# Patient Record
Sex: Male | Born: 1935 | Race: White | Hispanic: No | Marital: Married | Smoking: Former smoker
Health system: Southern US, Community
[De-identification: ages and names within clinical notes are randomized; demographics above are authoritative.]

## PROBLEM LIST (undated history)

## (undated) DIAGNOSIS — J449 Chronic obstructive pulmonary disease, unspecified: Secondary | ICD-10-CM

## (undated) DIAGNOSIS — K8020 Calculus of gallbladder without cholecystitis without obstruction: Secondary | ICD-10-CM

## (undated) DIAGNOSIS — E785 Hyperlipidemia, unspecified: Secondary | ICD-10-CM

## (undated) DIAGNOSIS — S4290XA Fracture of unspecified shoulder girdle, part unspecified, initial encounter for closed fracture: Secondary | ICD-10-CM

## (undated) DIAGNOSIS — I10 Essential (primary) hypertension: Secondary | ICD-10-CM

## (undated) DIAGNOSIS — E039 Hypothyroidism, unspecified: Secondary | ICD-10-CM

## (undated) DIAGNOSIS — I471 Supraventricular tachycardia: Secondary | ICD-10-CM

## (undated) DIAGNOSIS — C3490 Malignant neoplasm of unspecified part of unspecified bronchus or lung: Secondary | ICD-10-CM

## (undated) DIAGNOSIS — R918 Other nonspecific abnormal finding of lung field: Principal | ICD-10-CM

## (undated) DIAGNOSIS — E119 Type 2 diabetes mellitus without complications: Secondary | ICD-10-CM

## (undated) DIAGNOSIS — K5730 Diverticulosis of large intestine without perforation or abscess without bleeding: Secondary | ICD-10-CM

## (undated) HISTORY — DX: Supraventricular tachycardia: I47.1

## (undated) HISTORY — DX: Other nonspecific abnormal finding of lung field: R91.8

## (undated) HISTORY — PX: INGUINAL HERNIA REPAIR: SHX194

## (undated) HISTORY — DX: Essential (primary) hypertension: I10

## (undated) HISTORY — DX: Calculus of gallbladder without cholecystitis without obstruction: K80.20

## (undated) HISTORY — DX: Hypothyroidism, unspecified: E03.9

## (undated) HISTORY — DX: Malignant neoplasm of unspecified part of unspecified bronchus or lung: C34.90

## (undated) HISTORY — DX: Hyperlipidemia, unspecified: E78.5

## (undated) HISTORY — PX: CATARACT EXTRACTION: SUR2

## (undated) HISTORY — DX: Chronic obstructive pulmonary disease, unspecified: J44.9

## (undated) HISTORY — DX: Diverticulosis of large intestine without perforation or abscess without bleeding: K57.30

## (undated) HISTORY — DX: Type 2 diabetes mellitus without complications: E11.9

## (undated) HISTORY — DX: Fracture of unspecified shoulder girdle, part unspecified, initial encounter for closed fracture: S42.90XA

---

## 1998-05-06 ENCOUNTER — Ambulatory Visit (HOSPITAL_COMMUNITY): Admission: RE | Admit: 1998-05-06 | Discharge: 1998-05-06 | Payer: Self-pay | Admitting: Family Medicine

## 1998-05-27 ENCOUNTER — Encounter: Payer: Self-pay | Admitting: Internal Medicine

## 1998-05-27 ENCOUNTER — Inpatient Hospital Stay (HOSPITAL_COMMUNITY): Admission: EM | Admit: 1998-05-27 | Discharge: 1998-06-11 | Payer: Self-pay | Admitting: Family Medicine

## 1998-06-03 ENCOUNTER — Encounter: Payer: Self-pay | Admitting: Pulmonary Disease

## 1998-07-02 ENCOUNTER — Encounter: Admission: RE | Admit: 1998-07-02 | Discharge: 1998-09-30 | Payer: Self-pay | Admitting: *Deleted

## 2001-08-28 ENCOUNTER — Encounter: Payer: Self-pay | Admitting: Family Medicine

## 2001-08-28 ENCOUNTER — Encounter: Admission: RE | Admit: 2001-08-28 | Discharge: 2001-08-28 | Payer: Self-pay | Admitting: Family Medicine

## 2002-01-16 ENCOUNTER — Encounter: Payer: Self-pay | Admitting: Surgery

## 2002-01-16 ENCOUNTER — Encounter: Admission: RE | Admit: 2002-01-16 | Discharge: 2002-01-16 | Payer: Self-pay | Admitting: Surgery

## 2002-02-04 ENCOUNTER — Encounter: Payer: Self-pay | Admitting: Surgery

## 2002-02-04 ENCOUNTER — Encounter: Admission: RE | Admit: 2002-02-04 | Discharge: 2002-02-04 | Payer: Self-pay | Admitting: Surgery

## 2002-02-05 ENCOUNTER — Ambulatory Visit (HOSPITAL_BASED_OUTPATIENT_CLINIC_OR_DEPARTMENT_OTHER): Admission: RE | Admit: 2002-02-05 | Discharge: 2002-02-05 | Payer: Self-pay | Admitting: Surgery

## 2003-10-15 ENCOUNTER — Encounter: Admission: RE | Admit: 2003-10-15 | Discharge: 2004-01-13 | Payer: Self-pay | Admitting: Family Medicine

## 2004-10-12 ENCOUNTER — Emergency Department (HOSPITAL_COMMUNITY): Admission: EM | Admit: 2004-10-12 | Discharge: 2004-10-12 | Payer: Self-pay | Admitting: Emergency Medicine

## 2005-01-26 ENCOUNTER — Inpatient Hospital Stay (HOSPITAL_COMMUNITY): Admission: EM | Admit: 2005-01-26 | Discharge: 2005-01-29 | Payer: Self-pay | Admitting: Emergency Medicine

## 2005-01-28 ENCOUNTER — Ambulatory Visit: Payer: Self-pay | Admitting: Cardiovascular Disease

## 2005-01-28 ENCOUNTER — Ambulatory Visit: Payer: Self-pay | Admitting: Pulmonary Disease

## 2005-02-23 ENCOUNTER — Ambulatory Visit: Payer: Self-pay | Admitting: Internal Medicine

## 2005-03-07 ENCOUNTER — Ambulatory Visit: Payer: Self-pay | Admitting: Internal Medicine

## 2005-03-16 ENCOUNTER — Inpatient Hospital Stay (HOSPITAL_COMMUNITY): Admission: EM | Admit: 2005-03-16 | Discharge: 2005-03-18 | Payer: Self-pay | Admitting: *Deleted

## 2005-04-04 ENCOUNTER — Ambulatory Visit: Payer: Self-pay | Admitting: Internal Medicine

## 2005-05-19 ENCOUNTER — Ambulatory Visit: Payer: Self-pay | Admitting: Internal Medicine

## 2005-06-01 ENCOUNTER — Encounter: Admission: RE | Admit: 2005-06-01 | Discharge: 2005-06-01 | Payer: Self-pay | Admitting: Family Medicine

## 2005-06-03 ENCOUNTER — Ambulatory Visit: Payer: Self-pay | Admitting: Internal Medicine

## 2005-06-20 ENCOUNTER — Ambulatory Visit: Payer: Self-pay | Admitting: Pulmonary Disease

## 2005-07-18 ENCOUNTER — Ambulatory Visit: Payer: Self-pay | Admitting: Internal Medicine

## 2005-12-07 ENCOUNTER — Ambulatory Visit: Payer: Self-pay | Admitting: Family Medicine

## 2005-12-08 ENCOUNTER — Ambulatory Visit: Payer: Self-pay | Admitting: Internal Medicine

## 2006-01-11 ENCOUNTER — Ambulatory Visit: Payer: Self-pay | Admitting: Family Medicine

## 2006-03-10 ENCOUNTER — Ambulatory Visit: Payer: Self-pay | Admitting: Family Medicine

## 2006-03-10 LAB — CONVERTED CEMR LAB
Calcium: 9.4 mg/dL (ref 8.4–10.5)
Chloride: 106 meq/L (ref 96–112)
Creatinine,U: 79.3 mg/dL
GFR calc non Af Amer: 88 mL/min
Hgb A1c MFr Bld: 6.3 % — ABNORMAL HIGH (ref 4.6–6.0)
LDL Cholesterol: 83 mg/dL (ref 0–99)
Microalb Creat Ratio: 2.5 mg/g (ref 0.0–30.0)
VLDL: 11 mg/dL (ref 0–40)

## 2006-05-14 DIAGNOSIS — I10 Essential (primary) hypertension: Secondary | ICD-10-CM | POA: Insufficient documentation

## 2006-05-14 DIAGNOSIS — J449 Chronic obstructive pulmonary disease, unspecified: Secondary | ICD-10-CM

## 2006-05-14 DIAGNOSIS — E039 Hypothyroidism, unspecified: Secondary | ICD-10-CM | POA: Insufficient documentation

## 2006-05-14 DIAGNOSIS — E119 Type 2 diabetes mellitus without complications: Secondary | ICD-10-CM

## 2006-05-14 DIAGNOSIS — E785 Hyperlipidemia, unspecified: Secondary | ICD-10-CM

## 2006-06-12 ENCOUNTER — Ambulatory Visit: Payer: Self-pay | Admitting: Family Medicine

## 2006-06-12 LAB — CONVERTED CEMR LAB
Calcium: 9.4 mg/dL (ref 8.4–10.5)
Chloride: 109 meq/L (ref 96–112)
Creatinine,U: 41.2 mg/dL
GFR calc Af Amer: 106 mL/min
GFR calc non Af Amer: 88 mL/min
HDL: 44.8 mg/dL (ref >39.0)
Hgb A1c MFr Bld: 6.6 % — ABNORMAL HIGH (ref 4.6–6.0)
LDL Cholesterol: 81 mg/dL (ref 0–99)
Sodium: 146 meq/L — ABNORMAL HIGH (ref 135–145)
VLDL: 15 mg/dL (ref 0–40)

## 2006-06-28 ENCOUNTER — Ambulatory Visit: Payer: Self-pay | Admitting: Pulmonary Disease

## 2006-06-28 ENCOUNTER — Ambulatory Visit: Payer: Self-pay | Admitting: Gastroenterology

## 2006-07-12 ENCOUNTER — Ambulatory Visit: Payer: Self-pay | Admitting: Gastroenterology

## 2006-07-12 LAB — HM COLONOSCOPY

## 2006-09-26 ENCOUNTER — Ambulatory Visit: Payer: Self-pay | Admitting: Family Medicine

## 2006-09-27 LAB — CONVERTED CEMR LAB
AST: 21 units/L (ref 0–37)
Calcium: 9.7 mg/dL (ref 8.4–10.5)
Cholesterol: 156 mg/dL (ref 0–200)
GFR calc Af Amer: 84 mL/min
GFR calc non Af Amer: 70 mL/min
Glucose, Bld: 128 mg/dL — ABNORMAL HIGH (ref 70–99)
LDL Cholesterol: 97 mg/dL (ref 0–99)
Potassium: 4.5 meq/L (ref 3.5–5.1)
Sodium: 144 meq/L (ref 135–145)
Total CHOL/HDL Ratio: 3.5
Triglycerides: 71 mg/dL (ref 0–149)

## 2006-09-28 ENCOUNTER — Telehealth (INDEPENDENT_AMBULATORY_CARE_PROVIDER_SITE_OTHER): Payer: Self-pay | Admitting: *Deleted

## 2006-12-29 ENCOUNTER — Ambulatory Visit: Payer: Self-pay | Admitting: Family Medicine

## 2006-12-31 LAB — CONVERTED CEMR LAB
BUN: 16 mg/dL (ref 6–23)
CO2: 31 meq/L (ref 19–32)
Calcium: 9.3 mg/dL (ref 8.4–10.5)
Chloride: 105 meq/L (ref 96–112)
Creatinine, Ser: 0.9 mg/dL (ref 0.4–1.5)
Hgb A1c MFr Bld: 6.6 % — ABNORMAL HIGH (ref 4.6–6.0)
TSH: 4.62 microintl units/mL (ref 0.35–5.50)

## 2007-01-01 ENCOUNTER — Encounter (INDEPENDENT_AMBULATORY_CARE_PROVIDER_SITE_OTHER): Payer: Self-pay | Admitting: *Deleted

## 2007-01-01 ENCOUNTER — Telehealth (INDEPENDENT_AMBULATORY_CARE_PROVIDER_SITE_OTHER): Payer: Self-pay | Admitting: *Deleted

## 2007-04-18 ENCOUNTER — Ambulatory Visit: Payer: Self-pay | Admitting: Family Medicine

## 2007-04-20 ENCOUNTER — Encounter (INDEPENDENT_AMBULATORY_CARE_PROVIDER_SITE_OTHER): Payer: Self-pay | Admitting: *Deleted

## 2007-04-20 LAB — CONVERTED CEMR LAB
ALT: 11 units/L (ref 0–53)
AST: 17 units/L (ref 0–37)
CO2: 29 meq/L (ref 19–32)
Cholesterol: 141 mg/dL (ref 0–200)
Creatinine, Ser: 1 mg/dL (ref 0.4–1.5)
GFR calc Af Amer: 94 mL/min
Glucose, Bld: 106 mg/dL — ABNORMAL HIGH (ref 70–99)
Microalb Creat Ratio: 5.1 mg/g (ref 0.0–30.0)
Microalb, Ur: 0.2 mg/dL (ref 0.0–1.9)
PSA: 1.31 ng/mL (ref 0.10–4.00)
Potassium: 4.2 meq/L (ref 3.5–5.1)
Total CHOL/HDL Ratio: 3.6
Triglycerides: 79 mg/dL (ref 0–149)

## 2007-07-17 ENCOUNTER — Ambulatory Visit: Payer: Self-pay | Admitting: Internal Medicine

## 2007-07-17 DIAGNOSIS — K5730 Diverticulosis of large intestine without perforation or abscess without bleeding: Secondary | ICD-10-CM | POA: Insufficient documentation

## 2007-07-17 DIAGNOSIS — Z8719 Personal history of other diseases of the digestive system: Secondary | ICD-10-CM

## 2007-10-22 ENCOUNTER — Ambulatory Visit: Payer: Self-pay | Admitting: Internal Medicine

## 2007-10-26 ENCOUNTER — Telehealth (INDEPENDENT_AMBULATORY_CARE_PROVIDER_SITE_OTHER): Payer: Self-pay | Admitting: *Deleted

## 2007-10-26 LAB — CONVERTED CEMR LAB
Basophils Absolute: 0.1 10*3/uL (ref 0.0–0.1)
Basophils Relative: 1.2 % (ref 0.0–3.0)
CO2: 32 meq/L (ref 19–32)
Chloride: 103 meq/L (ref 96–112)
Creatinine, Ser: 1 mg/dL (ref 0.4–1.5)
Eosinophils Absolute: 0.2 10*3/uL (ref 0.0–0.7)
GFR calc non Af Amer: 78 mL/min
Hgb A1c MFr Bld: 7 % — ABNORMAL HIGH (ref 4.6–6.0)
Lymphocytes Relative: 18.3 % (ref 12.0–46.0)
MCHC: 33.7 g/dL (ref 30.0–36.0)
MCV: 100.3 fL — ABNORMAL HIGH (ref 78.0–100.0)
Neutrophils Relative %: 71.2 % (ref 43.0–77.0)
Platelets: 175 10*3/uL (ref 150–400)
Potassium: 4.7 meq/L (ref 3.5–5.1)
RBC: 4.1 M/uL — ABNORMAL LOW (ref 4.22–5.81)
TSH: 2.79 microintl units/mL (ref 0.35–5.50)
WBC: 7 10*3/uL (ref 4.5–10.5)

## 2008-01-21 ENCOUNTER — Telehealth (INDEPENDENT_AMBULATORY_CARE_PROVIDER_SITE_OTHER): Payer: Self-pay | Admitting: *Deleted

## 2008-02-07 ENCOUNTER — Ambulatory Visit: Payer: Self-pay | Admitting: Internal Medicine

## 2008-02-07 DIAGNOSIS — R799 Abnormal finding of blood chemistry, unspecified: Secondary | ICD-10-CM

## 2008-02-12 ENCOUNTER — Telehealth (INDEPENDENT_AMBULATORY_CARE_PROVIDER_SITE_OTHER): Payer: Self-pay | Admitting: *Deleted

## 2008-02-12 LAB — CONVERTED CEMR LAB
ALT: 13 units/L (ref 0–53)
AST: 17 units/L (ref 0–37)
Albumin: 4.4 g/dL (ref 3.5–5.2)
Alkaline Phosphatase: 61 units/L (ref 39–117)
Cholesterol: 140 mg/dL (ref 0–200)
HDL: 43.2 mg/dL (ref >39.0)
Hgb A1c MFr Bld: 6.8 % — ABNORMAL HIGH (ref 4.6–6.0)
Total CHOL/HDL Ratio: 3.2
Total Protein: 7.5 g/dL (ref 6.0–8.3)
Triglycerides: 39 mg/dL (ref 0–149)

## 2008-02-22 ENCOUNTER — Telehealth (INDEPENDENT_AMBULATORY_CARE_PROVIDER_SITE_OTHER): Payer: Self-pay | Admitting: *Deleted

## 2008-04-22 LAB — HM DIABETES EYE EXAM: HM Diabetic Eye Exam: NORMAL

## 2008-05-12 ENCOUNTER — Encounter: Payer: Self-pay | Admitting: Internal Medicine

## 2008-06-05 ENCOUNTER — Ambulatory Visit: Payer: Self-pay | Admitting: Internal Medicine

## 2008-06-10 LAB — CONVERTED CEMR LAB
CO2: 31 meq/L (ref 19–32)
Calcium: 9.3 mg/dL (ref 8.4–10.5)
Glucose, Bld: 113 mg/dL — ABNORMAL HIGH (ref 70–99)
Microalb, Ur: 0.8 mg/dL (ref 0.0–1.9)
PSA: 2.14 ng/mL (ref 0.10–4.00)
Potassium: 4.3 meq/L (ref 3.5–5.1)
Sodium: 142 meq/L (ref 135–145)

## 2008-07-22 ENCOUNTER — Telehealth (INDEPENDENT_AMBULATORY_CARE_PROVIDER_SITE_OTHER): Payer: Self-pay | Admitting: *Deleted

## 2008-07-22 ENCOUNTER — Ambulatory Visit: Payer: Self-pay | Admitting: Pulmonary Disease

## 2008-09-23 ENCOUNTER — Ambulatory Visit: Payer: Self-pay | Admitting: Pulmonary Disease

## 2008-10-15 ENCOUNTER — Encounter (INDEPENDENT_AMBULATORY_CARE_PROVIDER_SITE_OTHER): Payer: Self-pay | Admitting: *Deleted

## 2008-10-17 ENCOUNTER — Ambulatory Visit: Payer: Self-pay | Admitting: Pulmonary Disease

## 2008-11-05 ENCOUNTER — Ambulatory Visit: Payer: Self-pay | Admitting: Internal Medicine

## 2008-11-07 LAB — CONVERTED CEMR LAB
ALT: 10 units/L (ref 0–53)
AST: 17 units/L (ref 0–37)
CO2: 32 meq/L (ref 19–32)
Calcium: 9.5 mg/dL (ref 8.4–10.5)
Creatinine, Ser: 1 mg/dL (ref 0.4–1.5)
GFR calc non Af Amer: 77.29 mL/min (ref >60)
Glucose, Bld: 123 mg/dL — ABNORMAL HIGH (ref 70–99)
Hgb A1c MFr Bld: 6.8 % — ABNORMAL HIGH (ref 4.6–6.5)
PSA: 1.47 ng/mL (ref 0.10–4.00)

## 2009-02-09 ENCOUNTER — Ambulatory Visit: Payer: Self-pay | Admitting: Internal Medicine

## 2009-03-06 ENCOUNTER — Ambulatory Visit: Payer: Self-pay | Admitting: Internal Medicine

## 2009-03-10 ENCOUNTER — Encounter (INDEPENDENT_AMBULATORY_CARE_PROVIDER_SITE_OTHER): Payer: Self-pay | Admitting: *Deleted

## 2009-03-10 LAB — CONVERTED CEMR LAB
Basophils Relative: 0.1 % (ref 0.0–3.0)
CO2: 30 meq/L (ref 19–32)
Chloride: 102 meq/L (ref 96–112)
Eosinophils Absolute: 0.2 10*3/uL (ref 0.0–0.7)
Glucose, Bld: 138 mg/dL — ABNORMAL HIGH (ref 70–99)
Hemoglobin: 14.1 g/dL (ref 13.0–17.0)
Lymphs Abs: 1 10*3/uL (ref 0.7–4.0)
MCHC: 33.4 g/dL (ref 30.0–36.0)
MCV: 100.9 fL — ABNORMAL HIGH (ref 78.0–100.0)
Monocytes Absolute: 0.8 10*3/uL (ref 0.1–1.0)
Neutro Abs: 9.6 10*3/uL — ABNORMAL HIGH (ref 1.4–7.7)
RBC: 4.19 M/uL — ABNORMAL LOW (ref 4.22–5.81)
Sodium: 140 meq/L (ref 135–145)
Total CHOL/HDL Ratio: 3
Triglycerides: 76 mg/dL (ref 0.0–149.0)

## 2009-03-25 ENCOUNTER — Telehealth (INDEPENDENT_AMBULATORY_CARE_PROVIDER_SITE_OTHER): Payer: Self-pay | Admitting: *Deleted

## 2009-04-20 ENCOUNTER — Ambulatory Visit: Payer: Self-pay | Admitting: Internal Medicine

## 2009-07-06 ENCOUNTER — Ambulatory Visit: Payer: Self-pay | Admitting: Internal Medicine

## 2009-07-15 ENCOUNTER — Telehealth (INDEPENDENT_AMBULATORY_CARE_PROVIDER_SITE_OTHER): Payer: Self-pay | Admitting: *Deleted

## 2009-07-15 LAB — CONVERTED CEMR LAB
Basophils Relative: 0.3 % (ref 0.0–3.0)
Eosinophils Relative: 3 % (ref 0.0–5.0)
HCT: 40.2 % (ref 39.0–52.0)
Hemoglobin: 13.7 g/dL (ref 13.0–17.0)
MCV: 96.8 fL (ref 78.0–100.0)
Monocytes Absolute: 0.5 10*3/uL (ref 0.1–1.0)
Neutrophils Relative %: 76.2 % (ref 43.0–77.0)
RBC: 4.15 M/uL — ABNORMAL LOW (ref 4.22–5.81)
WBC: 7.3 10*3/uL (ref 4.5–10.5)

## 2009-10-19 ENCOUNTER — Ambulatory Visit: Payer: Self-pay | Admitting: Internal Medicine

## 2009-10-19 LAB — CONVERTED CEMR LAB
ALT: 9 units/L (ref 0–53)
AST: 16 units/L (ref 0–37)
CO2: 30 meq/L (ref 19–32)
Calcium: 9.9 mg/dL (ref 8.4–10.5)
Glucose, Bld: 114 mg/dL — ABNORMAL HIGH (ref 70–99)
HDL: 48.9 mg/dL (ref >39.00)
Microalb, Ur: 2.2 mg/dL — ABNORMAL HIGH (ref 0.0–1.9)
Potassium: 5.8 meq/L — ABNORMAL HIGH (ref 3.5–5.1)
Sodium: 141 meq/L (ref 135–145)

## 2009-10-23 ENCOUNTER — Telehealth: Payer: Self-pay | Admitting: Internal Medicine

## 2009-11-03 ENCOUNTER — Ambulatory Visit: Payer: Self-pay | Admitting: Internal Medicine

## 2009-11-06 LAB — CONVERTED CEMR LAB
BUN: 25 mg/dL — ABNORMAL HIGH (ref 6–23)
CO2: 27 meq/L (ref 19–32)
Chloride: 103 meq/L (ref 96–112)
Glucose, Bld: 91 mg/dL (ref 70–99)
Potassium: 5 meq/L (ref 3.5–5.1)

## 2010-02-19 ENCOUNTER — Ambulatory Visit: Payer: Self-pay | Admitting: Internal Medicine

## 2010-02-24 LAB — CONVERTED CEMR LAB
AST: 16 units/L (ref 0–37)
TSH: 1.39 microintl units/mL (ref 0.35–5.50)

## 2010-04-22 NOTE — Assessment & Plan Note (Signed)
Summary: 3 month roa//lch/rsh from bmp//lch   Vital Signs:  Patient profile:   77 year old male Weight:      182.50 pounds Pulse rate:   76 / minute Pulse rhythm:   regular BP sitting:   138 / 78  (left arm) Cuff size:   regular  Vitals Entered By: Army Fossa CMA (October 19, 2009 8:53 AM) CC: Pt here for f/u on DM: Fasting.   History of Present Illness: ROV  Diabetes-- base on last A1C , metformin dose increased , ambulatory CBGs in the low 100s never more than 130 Hyperlipidemia-- good medication compliance  hypothyroidism-- synthroid dose was increased, reports good medication compliance  Hypertension-- ambulatory BPs  varies but usually around 120/70  Current Medications (verified): 1)  Metformin Hcl 500 Mg  Tabs (Metformin Hcl) .Marland Kitchen.. 1 1/2 By Mouth Two Times A Day 2)  Niacin Cr 1000 Mg  Tbcr (Niacin) .... 2 By Mouth Qd 3)  Gemfibrozil 600 Mg  Tabs (Gemfibrozil) .... Take One Tablet Twice Daily 4)  Synthroid 200 Mcg Tabs (Levothyroxine Sodium) .Marland Kitchen.. 1 Daily - Due Office Visit July 2011 5)  Centrum   Tabs (Multiple Vitamins-Minerals) .... Take 1 Tablet By Mouth Once A Day 6)  Aspirin 81 Mg  Tbec (Aspirin) .... Take 1 Tablet By Mouth Once A Day 7)  Onetouch Ultra Test  Strp (Glucose Blood) .... Check Blood Sugar As Directed. 8)  Diovan Hct 320-25 Mg  Tabs (Valsartan-Hydrochlorothiazide) .... One By Mouth Daily 9)  Symbicort 160-4.5 Mcg/act  Aero (Budesonide-Formoterol Fumarate) .... 2 Puffs First Thing  in Am and 2 Puffs Again in Pm About 12 Hours Later  Allergies (verified): No Known Drug Allergies  Past History:  Past Medical History: COPD--on night O2    - PFT's 7/ 30/2010  FEV1 1.31.( 62%)  ratio 36    - HFA 50% April 20, 2009  Diabetes mellitus, type II Hyperlipidemia hypothyroidism Hypertension     - DC ACE February 09, 2009 (cough) > resolved Diverticulosis, colon gallstones DIVERTICULOSIS, COLON    Past Surgical History: Reviewed history from  07/17/2007 and no changes required. Inguinal herniorrhaphy Cataract extraction  Social History: Reviewed history from 03/06/2009 and no changes required. Married children x 4 (3 girls ) retired Production designer, theatre/television/film for [employer] Former Smoker Alcohol use-no exercise-- active,walks weather permit   Review of Systems CV:  Denies chest pain or discomfort and swelling of feet. Resp:  Denies coughing up blood; no cough -wheezing lately  still usung inhalers as needed . GI:  Denies bloody stools, diarrhea, nausea, and vomiting. Endo:  no low sugar symptoms  no LE paresthesias .  Physical Exam  General:  alert, well-developed, and well-nourished.   Lungs:  normal respiratory effort, no intercostal retractions, no accessory muscle use, and slightly decreased breath sounds.   Heart:  normal rate, regular rhythm, and no murmur.   Pulses:   normal pedal pulses bilaterally Extremities:  no  lower extremity edema Psych:  Oriented X3, not anxious appearing, and not depressed appearing.    Diabetes Management Exam:    Foot Exam (with socks and/or shoes not present):       Sensory-Pinprick/Light touch:          Left medial foot (L-4): normal          Left dorsal foot (L-5): normal          Left lateral foot (S-1): normal          Right  medial foot (L-4): normal          Right dorsal foot (L-5): normal          Right lateral foot (S-1): normal       Sensory-Monofilament:          Left foot: normal          Right foot: normal       Inspection:          Left foot: normal          Right foot: normal       Nails:          Left foot: normal          Right foot: normal   Impression & Recommendations:  Problem # 1:  HYPERTENSION (ICD-401.9) at goal, labs His updated medication list for this problem includes:    Diovan Hct 320-25 Mg Tabs (Valsartan-hydrochlorothiazide) ..... One by mouth daily  Orders: TLB-BMP (Basic Metabolic Panel-BMET) (80048-METABOL)  BP today: 138/78 Prior BP:  130/82 (07/06/2009)  Labs Reviewed: K+: 4.4 (03/06/2009) Creat: : 1.0 (03/06/2009)   Chol: 138 (03/06/2009)   HDL: 42.40 (03/06/2009)   LDL: 80 (03/06/2009)   TG: 76.0 (03/06/2009)  Problem # 2:  HYPERLIPIDEMIA (ICD-272.4) due for labs, good compliance His updated medication list for this problem includes:    Niacin Cr 1000 Mg Tbcr (Niacin) .Marland Kitchen... 2 by mouth once daily    Gemfibrozil 600 Mg Tabs (Gemfibrozil) .Marland Kitchen... Take one tablet twice daily  Orders: TLB-ALT (SGPT) (84460-ALT) TLB-AST (SGOT) (84450-SGOT) TLB-Lipid Panel (80061-LIPID) Venipuncture (56213) Specimen Handling (08657)  Labs Reviewed: SGOT: 20 (07/06/2009)   SGPT: 10 (07/06/2009)   HDL:42.40 (03/06/2009), 43.2 (02/07/2008)  LDL:80 (03/06/2009), 89 (02/07/2008)  Chol:138 (03/06/2009), 140 (02/07/2008)  Trig:76.0 (03/06/2009), 39 (02/07/2008)  Problem # 3:  DIABETES MELLITUS, TYPE II (ICD-250.00) based on the last hemoglobin A1c, we increased the metformin dose. labs  His updated medication list for this problem includes:    Metformin Hcl 500 Mg Tabs (Metformin hcl) .Marland Kitchen... 1 1/2 by mouth two times a day    Aspirin 81 Mg Tbec (Aspirin) .Marland Kitchen... Take 1 tablet by mouth once a day    Diovan Hct 320-25 Mg Tabs (Valsartan-hydrochlorothiazide) ..... One by mouth daily  Orders: TLB-A1C / Hgb A1C (Glycohemoglobin) (83036-A1C) TLB-Microalbumin/Creat Ratio, Urine (82043-MALB)  Labs Reviewed: Creat: 1.0 (03/06/2009)     Last Eye Exam: normal (04/22/2008) Reviewed HgBA1c results: 7.4 (07/06/2009)  6.8 (03/06/2009)  Problem # 4:  HYPOTHYROIDISM NOS (ICD-244.9) Assessment: Unchanged based on his last TSH, we increase the Synthroid dose from 175 to 200 micrograms   His updated medication list for this problem includes:    Synthroid 200 Mcg Tabs (Levothyroxine sodium) .Marland Kitchen... 1 daily  Orders: TLB-TSH (Thyroid Stimulating Hormone) (84443-TSH) Specimen Handling (84696)  Complete Medication List: 1)  Metformin Hcl 500 Mg Tabs  (Metformin hcl) .Marland Kitchen.. 1 1/2 by mouth two times a day 2)  Niacin Cr 1000 Mg Tbcr (Niacin) .... 2 by mouth once daily 3)  Gemfibrozil 600 Mg Tabs (Gemfibrozil) .... Take one tablet twice daily 4)  Synthroid 200 Mcg Tabs (Levothyroxine sodium) .Marland Kitchen.. 1 daily 5)  Centrum Tabs (Multiple vitamins-minerals) .... Take 1 tablet by mouth once a day 6)  Aspirin 81 Mg Tbec (Aspirin) .... Take 1 tablet by mouth once a day 7)  Onetouch Ultra Test Strp (Glucose blood) .... Check blood sugar as directed. 8)  Diovan Hct 320-25 Mg Tabs (Valsartan-hydrochlorothiazide) .... One by mouth daily 9)  Symbicort  160-4.5 Mcg/act Aero (Budesonide-formoterol fumarate) .... 2 puffs first thing  in am and 2 puffs again in pm about 12 hours later  Patient Instructions: 1)  Please schedule a follow-up appointment in 4 months , complete physical exam

## 2010-04-22 NOTE — Progress Notes (Signed)
Summary: refill  Phone Note Refill Request Message from:  Fax from Pharmacy on [address] [address] fax [phone]  Refills Requested: Medication #1:  SYNTHROID 175 MCG  TABS TAKE ONE TABET DAILY Patient says this was already called in. We did not get it! Please resend.  Initial call taken by: Barb Merino,  March 25, 2009 3:05 PM    Prescriptions: SYNTHROID 175 MCG  TABS (LEVOTHYROXINE SODIUM) TAKE ONE TABET DAILY  #30 Tablet x 6   Entered by:   Shary Decamp   Authorized by:   Nolon Rod. Paz MD   Signed by:   Shary Decamp on 03/25/2009   Method used:   Electronically to        [facility]. # [id number]* (retail)       [address]       [place], [place]  [address]       Ph: [phone] or [phone]       Fax: [phone]   RxID:   [id number]

## 2010-04-22 NOTE — Assessment & Plan Note (Signed)
Summary: bp check and bmp//kn      Allergies Added: NKDA Nurse Visit   Vital Signs:  Patient profile:   77 year old male Weight:      179.50 pounds Pulse rate:   92 / minute Pulse rhythm:   regular BP sitting:   118 / 78  (left arm) Cuff size:   regular  Vitals Entered By: Army Fossa CMA (November 03, 2009 10:30 AM)  Impression & Recommendations:  Problem # 1:  HYPERTENSION (ICD-401.9) due to hyperkalemia, BP medication was decreased to half BP still at goal His updated medication list for this problem includes:    Diovan Hct 320-25 Mg Tabs (Valsartan-hydrochlorothiazide) .Marland Kitchen... 1/2 tab  by mouth daily  Orders: TLB-BMP (Basic Metabolic Panel-BMET) (80048-METABOL) Specimen Handling (16109)  BP today: 118/78 Prior BP: 138/78 (10/19/2009)  Labs Reviewed: K+: 5.8 (10/19/2009) Creat: : 1.0 (10/19/2009)   Chol: 139 (10/19/2009)   HDL: 48.90 (10/19/2009)   LDL: 77 (10/19/2009)   TG: 66.0 (10/19/2009)  Complete Medication List: 1)  Metformin Hcl 500 Mg Tabs (Metformin hcl) .Marland Kitchen.. 1 1/2 by mouth two times a day 2)  Niacin Cr 1000 Mg Tbcr (Niacin) .... 2 by mouth once daily 3)  Gemfibrozil 600 Mg Tabs (Gemfibrozil) .... Take one tablet twice daily 4)  Synthroid 200 Mcg Tabs (Levothyroxine sodium) .Marland Kitchen.. 1 daily 5)  Centrum Tabs (Multiple vitamins-minerals) .... Take 1 tablet by mouth once a day 6)  Aspirin 81 Mg Tbec (Aspirin) .... Take 1 tablet by mouth once a day 7)  Onetouch Ultra Test Strp (Glucose blood) .... Check blood sugar as directed. 8)  Diovan Hct 320-25 Mg Tabs (Valsartan-hydrochlorothiazide) .... 1/2 tab  by mouth daily 9)  Symbicort 160-4.5 Mcg/act Aero (Budesonide-formoterol fumarate) .... 2 puffs first thing  in am and 2 puffs again in pm about 12 hours later  CC: BP Check   Allergies (verified): No Known Drug Allergies  Orders Added: 1)  TLB-BMP (Basic Metabolic Panel-BMET) [80048-METABOL] 2)  Specimen Handling [99000] 3)  Est. Patient Level I [60454]

## 2010-04-22 NOTE — Assessment & Plan Note (Signed)
Summary: Pulmonary/ ext f/u ov with hfa teaching   Copy to:  Jose E. Paz MD Primary Provider/Referring Provider:  Nolon Rod. Paz MD  CC:  Pt here for follow up. Pt c/o intermittent productive cough with yellow mucus x 4 days.  History of Present Illness: 44  yowm quit smoking around 2003  with  obesity and COPD on nocturnal O2 FEV-1 of 64% predicted (3/07) with no significant improvement after bronchodilators and persistent hypoxemia on nocturnal study dated June 2007 where he spent 37 minutes at a saturation less 88%, and, therefore, it was recommended he stay on oxygen.  CXR 3/07 chronic changes, scarring Quit smoking 2003  February 09, 2009 ov main c/o persistent dry cough x sev months. no limitations in terms of sob, no need for inhalers or nocturnal exac. rec stop captopril and hctz start diovan 320/25 one daily   April 20, 2009 Pt here for follow up. Pt c/o intermittent productive cough with yellow mucus x 4 days esp when lie down.  better with inhaler, he thinks it's proaire.  Pt denies any significant sore throat, dysphagia, itching, sneezing,  nasal congestion or excess secretions,  fever, chills, sweats, unintended wt loss, pleuritic or exertional cp, hempoptysis, change in activity tolerance  orthopnea pnd or leg swelling Pt also denies any obvious fluctuation in symptoms with weather or environmental change or other alleviating or aggravating factors.       Current Medications (verified): 1)  Metformin Hcl 500 Mg  Tabs (Metformin Hcl) .... Take One Tablet By Mouth Twice Daily 2)  Niacin Cr 1000 Mg  Tbcr (Niacin) .... 2 By Mouth Qd 3)  Gemfibrozil 600 Mg  Tabs (Gemfibrozil) .... Take One Tablet Twice Daily 4)  Synthroid 175 Mcg  Tabs (Levothyroxine Sodium) .... Take One Tabet Daily 5)  Centrum   Tabs (Multiple Vitamins-Minerals) .... Take 1 Tablet By Mouth Once A Day 6)  Aspirin 81 Mg  Tbec (Aspirin) .... Take 1 Tablet By Mouth Once A Day 7)  Onetouch Ultra Test  Strp (Glucose  Blood) .... Check Blood Sugar As Directed. 8)  Diovan Hct 320-25 Mg  Tabs (Valsartan-Hydrochlorothiazide) .... One By Mouth Daily  Allergies (verified): No Known Drug Allergies  Past History:  Past Medical History: COPD--on night O2    - PFT's 7/ 30/2010  FEV1 1.31.( 62%)  ratio 36    - HFA 50% April 20, 2009  Diabetes mellitus, type II Hyperlipidemia hypothyroidism Hypertension     - DC ACE February 09, 2009 (cough) > resolved Diverticulosis, colon gallstones DIVERTICULOSIS, COLON    Vital Signs:  Patient profile:   77 year old male Height:      69 inches Weight:      191.25 pounds O2 Sat:      94 % on Room air Temp:     98.0 degrees F oral Pulse rate:   85 / minute BP sitting:   134 / 62  (left arm) Cuff size:   regular  Vitals Entered By: Zackery Barefoot CMA (April 20, 2009 2:19 PM)  O2 Flow:  Room air CC: Pt here for follow up. Pt c/o intermittent productive cough with yellow mucus x 4 days Comments Medications reviewed with patient Verified pt's contact number Zackery Barefoot CMA  April 20, 2009 2:21 PM    Physical Exam  Additional Exam:  wt 185 > 187 February 09, 2009 > 191 April 20, 2009  Gen. Pleasant, well-nourished, in no distress ENT - no lesions, no post nasal drip,  edentulous Neck: No JVD, no thyromegaly, no carotid bruits Lungs: no use of accessory muscles, no dullness to percussion,  mid exp wheeze, better with purse lips Cardiovascular: Rhythm regular, heart sounds  normal, no murmurs or gallops, no peripheral edema Musculoskeletal: No deformities, no cyanosis or clubbing      Impression & Recommendations:  Problem # 1:  COPD (ICD-496) I spent extra time with the patient today explaining optimal mdi  technique.  This improved from  25-50% effective.  Since responds well to what he thinks is proaire should be a good candidate for trial of symbicort for a significant AB component - though one could argue that might be over treatment  for his chronic rx.    Each maintenance medication was reviewed in detail including most importantly the difference between maintenance and as needed and under what circumstances the prns are to be used. See instructions for specific recommendations   Medications Added to Medication List This Visit: 1)  Centrum Tabs (Multiple vitamins-minerals) .... Take 1 tablet by mouth once a day 2)  Aspirin 81 Mg Tbec (Aspirin) .... Take 1 tablet by mouth once a day 3)  Symbicort 160-4.5 Mcg/act Aero (Budesonide-formoterol fumarate) .... 2 puffs first thing  in am and 2 puffs again in pm about 12 hours later  Other Orders: HFA Instruction [phone]) Est. Patient Level IV (60454)  Patient Instructions: 1)  Work on inhaler technique:  relax and blow all the way out then take a nice smooth deep breath back in, triggering the inhaler at same time you start breathing in 2)  Symbicort 2 puffs first thing  in am and 2 puffs again in pm about 12 hours later and if you like it fill the prescription and return as needed Prescriptions: SYMBICORT 160-4.5 MCG/ACT  AERO (BUDESONIDE-FORMOTEROL FUMARATE) 2 puffs first thing  in am and 2 puffs again in pm about 12 hours later  #1 x 11   Entered and Authorized by:   Nyoka Cowden MD   Signed by:   Nyoka Cowden MD on 04/20/2009   Method used:   Print then Mail to Patient   RxID:   [id number]    Immunization History:  Influenza Immunization History:    Influenza:  historical (01/12/2009)

## 2010-04-22 NOTE — Progress Notes (Signed)
Summary: lab results  Phone Note Outgoing Call   Summary of Call: advise patient -diabetes is better continue present care -potassium is elevated, advised to take only half of the  Diovan HCT  instead of one tablet  -nurse visit fora BMP and BP check in 10 days Lyric Rossano E. Braelyn Bordonaro MD  October 23, 2009 3:25 PM   Follow-up for Phone Call        spoke with pt he is aware of lab results and med changes. Will call to make appt in 10 days. Army Fossa CMA  October 23, 2009 3:31 PM     New/Updated Medications: DIOVAN HCT 320-25 MG  TABS (VALSARTAN-HYDROCHLOROTHIAZIDE) 1/2 tab  by mouth daily

## 2010-04-22 NOTE — Assessment & Plan Note (Signed)
Summary: cpx/fasting//kn   Vital Signs:  Patient profile:   77 year old male Height:      69 inches Weight:      179.25 pounds Pulse rate:   87 / minute Pulse rhythm:   regular BP sitting:   138 / 82  (left arm) Cuff size:   regular  Vitals Entered By: Army Fossa CMA (February 19, 2010 8:41 AM) CC: CPX, fasting  [facility]    History of Present Illness: Here for Medicare AWV: 1.   Risk factors based on Past M, S, F history: reviewed  2.   Physical Activities: walks 2 miles 3/week  3.   Depression/mood: denies, no problems noted  4.   Hearing: denies problems , no problems noted  5.   ADL's: totally independent, still drives  6.   Fall Risk: no recent falls, moderate risk d/t vision.counseled (lights at night, uncluttered house,                   etc) 7.   Home Safety: does feel safe at home  8.   Height, weight, &visual acuity: see VS, not as good as before, to see eye doctor in March                         2012, I  rec to see him sooner since his vision needs improvment   9.   Counseling:  yes  10.   Labs ordered based on risk factors: yes  11.           Referral Coordination, if needed  12.           Care Plan, see a/p  13.            Cognitive Assessment : cognition, motor skills and memory seems appropriate  in addition, we discussed the following issues  COPD-- on symbicort only, uses night O2  Diabetes -- ambulatory CBGs 90s to 115s on average  Hyperlipidemia-- good medication compliance  hypothyroidism-- good medication compliance  Hypertension-- ambulatory BPs  104-120/ 60-70      Current Medications (verified): 1)  Metformin Hcl 500 Mg  Tabs (Metformin Hcl) .Marland Kitchen.. 1 1/2 By Mouth Two Times A Day 2)  Niacin Cr 1000 Mg  Tbcr (Niacin) .... 2 By Mouth Once Daily 3)  Gemfibrozil 600 Mg  Tabs (Gemfibrozil) .... Take One Tablet Twice Daily 4)  Synthroid 200 Mcg Tabs (Levothyroxine Sodium) .Marland Kitchen.. 1 Daily 5)  Centrum   Tabs (Multiple  Vitamins-Minerals) .... Take 1 Tablet By Mouth Once A Day 6)  Aspirin 81 Mg  Tbec (Aspirin) .... Take 1 Tablet By Mouth Once A Day 7)  Onetouch Ultra Test  Strp (Glucose Blood) .... Check Blood Sugar As Directed. 8)  Diovan Hct 320-25 Mg  Tabs (Valsartan-Hydrochlorothiazide) .... 1/2 Tab  By Mouth Daily 9)  Symbicort 160-4.5 Mcg/act  Aero (Budesonide-Formoterol Fumarate) .... 2 Puffs First Thing  in Am and 2 Puffs Again in Pm About 12 Hours Later  Allergies (verified): No Known Drug Allergies  Past History:  Past Medical History: COPD--on night O2    - PFT's 7/ 30/2010  FEV1 1.31.( 62%)  ratio 36    - HFA 50% April 20, 2009  Diabetes mellitus, type II Hyperlipidemia hypothyroidism Hypertension     - DC ACE February 09, 2009 (cough) > resolved Diverticulosis, colon gallstones     Past Surgical History: Reviewed history from 07/17/2007 and  no changes required. Inguinal herniorrhaphy Cataract extraction  Family History: Reviewed history from 07/17/2007 and no changes required. HTN - M DM - no CAD - M (MI) Stroke - no colon ca - no prostate ca - no  Social History: Reviewed history from 03/06/2009 and no changes required. Married children x 4 (3 girls ) retired Production designer, theatre/television/film for [employer] Former Smoker Alcohol use-no exercise-- active,walks weather permit   Review of Systems CV:  Denies chest pain or discomfort and swelling of feet. Resp:  Denies cough and coughing up blood; SOB at baseline . GI:  Denies bloody stools, diarrhea, nausea, and vomiting. GU:  Denies dysuria, hematuria, and urinary frequency; rarely has hesitation.  Physical Exam  General:  alert, well-developed, and well-nourished.   Neck:  no masses, no thyromegaly, and normal carotid upstroke.   Lungs:  normal respiratory effort, no intercostal retractions, no accessory muscle use, and   decreased breath sounds.   Heart:  normal rate, regular rhythm, and no murmur.   Abdomen:  soft,  non-tender, no distention, no masses, no guarding, and no rigidity. no bruit   Rectal:  No external abnormalities noted. Normal sphincter tone. No rectal masses or tenderness. Prostate:  Prostate gland firm and smooth, no enlargement, nodularity, tenderness, mass, asymmetry or induration. No nodularity noted today Extremities:  no pretibial edema bilaterally  Psych:  Oriented X3, memory intact for recent and remote, normally interactive, good eye contact, not anxious appearing, and not depressed appearing.     Impression & Recommendations:  Problem # 1:  HEALTH SCREENING (ICD-V70.0)  Td  2009 Pneumonia shot 2006, and today  flu shot discussed  had a  shingles shot  Cscope 06-2006 @ [facility], next Cscope  "as needed"  counseling about diet. Also see HPI Continue exercising as he is doing PSA, see  next  Orders: Medicare -1st Annual Wellness Visit [phone])  Problem # 2:  ? of NODULAR PROSTATE WITHOUT URINARY OBSTRUCTION (ICD-600.10) today  exam showed no nodule  Orders: TLB-PSA (Prostate Specific Antigen) (84153-PSA)  Problem # 3:  HYPERTENSION (ICD-401.9) at goal  His updated medication list for this problem includes:    Diovan Hct 320-25 Mg Tabs (Valsartan-hydrochlorothiazide) .Marland Kitchen... 1/2 tab  by mouth daily  BP today: 138/82 Prior BP: 118/78 (11/03/2009)  Labs Reviewed: K+: 5.0 (11/03/2009) Creat: : 1.0 (11/03/2009)   Chol: 139 (10/19/2009)   HDL: 48.90 (10/19/2009)   LDL: 77 (10/19/2009)   TG: 66.0 (10/19/2009)  Problem # 4:  HYPERLIPIDEMIA (ICD-272.4) labs His updated medication list for this problem includes:    Niacin Cr 1000 Mg Tbcr (Niacin) .Marland Kitchen... 2 by mouth once daily    Gemfibrozil 600 Mg Tabs (Gemfibrozil) .Marland Kitchen... Take one tablet twice daily  Orders: TLB-ALT (SGPT) (84460-ALT) TLB-AST (SGOT) (84450-SGOT)  Labs Reviewed: SGOT: 16 (10/19/2009)   SGPT: 9 (10/19/2009)   HDL:48.90 (10/19/2009), 42.40 (03/06/2009)  LDL:77 (10/19/2009), 80 (03/06/2009)  Chol:139  (10/19/2009), 138 (03/06/2009)  Trig:66.0 (10/19/2009), 76.0 (03/06/2009)  Problem # 5:  DIABETES MELLITUS, TYPE II (ICD-250.00) labs to have an eye exam 05-2010, encouraged to go sooner if problems   His updated medication list for this problem includes:    Metformin Hcl 500 Mg Tabs (Metformin hcl) .Marland Kitchen... 1 1/2 by mouth two times a day    Aspirin 81 Mg Tbec (Aspirin) .Marland Kitchen... Take 1 tablet by mouth once a day    Diovan Hct 320-25 Mg Tabs (Valsartan-hydrochlorothiazide) .Marland Kitchen... 1/2 tab  by mouth daily  Orders: Venipuncture (98119) TLB-A1C /  Hgb A1C (Glycohemoglobin) (83036-A1C) Specimen Handling ([address])  Labs Reviewed: Creat: 1.0 (11/03/2009)     Last Eye Exam: normal (04/22/2008) Reviewed HgBA1c results: 7.0 (10/19/2009)  7.4 (07/06/2009)  Problem # 6:  COPD (ICD-496) stable at present  inmunizations offered, see #1 His updated medication list for this problem includes:    Symbicort 160-4.5 Mcg/act Aero (Budesonide-formoterol fumarate) .Marland Kitchen... 2 puffs first thing  in am and 2 puffs again in pm about 12 hours later  Complete Medication List: 1)  Metformin Hcl 500 Mg Tabs (Metformin hcl) .Marland Kitchen.. 1 1/2 by mouth two times a day 2)  Niacin Cr 1000 Mg Tbcr (Niacin) .... 2 by mouth once daily 3)  Gemfibrozil 600 Mg Tabs (Gemfibrozil) .... Take one tablet twice daily 4)  Synthroid 200 Mcg Tabs (Levothyroxine sodium) .Marland Kitchen.. 1 daily 5)  Centrum Tabs (Multiple vitamins-minerals) .... Take 1 tablet by mouth once a day 6)  Aspirin 81 Mg Tbec (Aspirin) .... Take 1 tablet by mouth once a day 7)  Onetouch Ultra Test Strp (Glucose blood) .... Check blood sugar as directed. 8)  Diovan Hct 320-25 Mg Tabs (Valsartan-hydrochlorothiazide) .... 1/2 tab  by mouth daily 9)  Symbicort 160-4.5 Mcg/act Aero (Budesonide-formoterol fumarate) .... 2 puffs first thing  in am and 2 puffs again in pm about 12 hours later  Other Orders: TLB-TSH (Thyroid Stimulating Hormone) (84443-TSH) Pneumococcal Vaccine (60454) Admin  1st Vaccine ([address])  Patient Instructions: 1)  Please schedule a follow-up appointment in 4 months .  Prescriptions: GEMFIBROZIL 600 MG  TABS (GEMFIBROZIL) Take one tablet twice daily  #60 Tablet x 3   Entered by:   Army Fossa CMA   Authorized by:   Nolon Rod. Wisam Siefring MD   Signed by:   Army Fossa CMA on 02/19/2010   Method used:   Electronically to        [id number]* (retail)       [address]       [place], [place]  [address]       Ph: [phone] or [phone]       Fax: [phone]   RxID:   [id number] SYNTHROID 200 MCG TABS (LEVOTHYROXINE SODIUM) 1 daily  #90 Tablet x 1   Entered by:   Army Fossa CMA   Authorized by:   Nolon Rod. Makeisha Jentsch MD   Signed by:   Army Fossa CMA on 02/19/2010   Method used:   Electronically to        [id number]* (retail)       [address]       [place], [place]  [address]       Ph: [phone] or [phone]       Fax: [phone]   RxID:   [id number] METFORMIN HCL 500 MG  TABS (METFORMIN HCL) 1 1/2 by mouth two times a day  #90 x 6   Entered by:   Army Fossa CMA   Authorized by:   Nolon Rod. Lucion Dilger MD   Signed by:   Army Fossa CMA on 02/19/2010   Method used:   Electronically to        [id number]* (retail)       [address]       [place], [place]  [address]  Ph: [phone] or [phone]       Fax: [phone]   RxID:   [id number]    Orders Added: 1)  Venipuncture [44010] 2)  TLB-A1C / Hgb A1C (Glycohemoglobin) [83036-A1C] 3)  TLB-PSA (Prostate Specific Antigen) [84153-PSA] 4)  TLB-ALT (SGPT) [84460-ALT] 5)  TLB-AST (SGOT) [84450-SGOT] 6)  TLB-TSH (Thyroid Stimulating Hormone) [84443-TSH] 7)  Pneumococcal Vaccine [90732] 8)  Admin 1st Vaccine [90471] 9)  Specimen Handling [99000] 10)  Est. Patient Level III [27253] 11)  Medicare -1st Annual Wellness Visit  [G0438]   Immunizations Administered:  Pneumonia Vaccine:    Vaccine Type: Pneumovax    Site: left deltoid    Mfr: Merck    Dose: 0.5 ml    Route: IM    Given by: Army Fossa CMA    Exp. Date: 07/21/2011    Lot #: [id number]   Immunizations Administered:  Pneumonia Vaccine:    Vaccine Type: Pneumovax    Site: left deltoid    Mfr: Merck    Dose: 0.5 ml    Route: IM    Given by: Army Fossa CMA    Exp. Date: 07/21/2011    Lot #: [id number]

## 2010-04-22 NOTE — Assessment & Plan Note (Signed)
Summary: 4  mth fu/ns/kdc   Vital Signs:  Patient profile:   77 year old male Height:      69 inches Weight:      190.2 pounds BMI:     28.19 Pulse rate:   62 / minute BP sitting:   130 / 82  Vitals Entered By: Shary Decamp (July 06, 2009 8:02 AM) CC: rov, fasting   History of Present Illness: ROV COPD--note from pulmonary reviewed, uses simbycort "as needed only" Diabetes-- ambulatory CBGs 80 to 120, saw the eye doctor 2-11, reportedly had a DM check recent CBC showed elevated WBC: recheck  hypothyroidism-- good medication compliance  Hypertension-- ambulatory BPs "good", no  recent cough      Current Medications (verified): 1)  Metformin Hcl 500 Mg  Tabs (Metformin Hcl) .... Take One Tablet By Mouth Twice Daily 2)  Niacin Cr 1000 Mg  Tbcr (Niacin) .... 2 By Mouth Qd 3)  Gemfibrozil 600 Mg  Tabs (Gemfibrozil) .... Take One Tablet Twice Daily 4)  Synthroid 175 Mcg  Tabs (Levothyroxine Sodium) .... Take One Tabet Daily 5)  Centrum   Tabs (Multiple Vitamins-Minerals) .... Take 1 Tablet By Mouth Once A Day 6)  Aspirin 81 Mg  Tbec (Aspirin) .... Take 1 Tablet By Mouth Once A Day 7)  Onetouch Ultra Test  Strp (Glucose Blood) .... Check Blood Sugar As Directed. 8)  Diovan Hct 320-25 Mg  Tabs (Valsartan-Hydrochlorothiazide) .... One By Mouth Daily 9)  Symbicort 160-4.5 Mcg/act  Aero (Budesonide-Formoterol Fumarate) .... 2 Puffs First Thing  in Am and 2 Puffs Again in Pm About 12 Hours Later  Allergies (verified): No Known Drug Allergies  Past History:  Past Medical History: COPD--on night O2    - PFT's 7/ 30/2010  FEV1 1.31.( 62%)  ratio 36    - HFA 50% April 20, 2009  Diabetes mellitus, type II Hyperlipidemia hypothyroidism Hypertension     - DC ACE February 09, 2009 (cough) > resolved Diverticulosis, colon gallstones DIVERTICULOSIS, COLON    Social History: Reviewed history from 03/06/2009 and no changes required. 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 GI:  Denies bloody stools, diarrhea, nausea, and vomiting. GU:  Denies hematuria, urinary frequency, and urinary hesitancy.  Physical Exam  General:  alert, well-developed, and well-nourished.   Lungs:  normal respiratory effort, no intercostal retractions, and no accessory muscle use.  Decreased BS, few end exp wheezes  Heart:  normal rate, regular rhythm, and no murmur.   Pulses:  normal pedal pulses bilaterally  Extremities:  no pretibial edema bilaterally   Diabetes Management Exam:    Foot Exam (with socks and/or shoes not present):       Sensory-Pinprick/Light touch:          Left medial foot (L-4): normal          Left dorsal foot (L-5): normal          Left lateral foot (S-1): normal          Right medial foot (L-4): normal          Right dorsal foot (L-5): normal          Right lateral foot (S-1): normal       Sensory-Monofilament:          Left foot: normal  Right foot: normal       Inspection:          Left foot: normal          Right foot: normal       Nails:          Left foot: normal          Right foot: normal   Impression & Recommendations:  Problem # 1:  HYPERTENSION (ICD-401.9) at goal  His updated medication list for this problem includes:    Diovan Hct 320-25 Mg Tabs (Valsartan-hydrochlorothiazide) ..... One by mouth daily  BP today: 130/82 Prior BP: 134/62 (04/20/2009)  Labs Reviewed: K+: 4.4 (03/06/2009) Creat: : 1.0 (03/06/2009)   Chol: 138 (03/06/2009)   HDL: 42.40 (03/06/2009)   LDL: 80 (03/06/2009)   TG: 76.0 (03/06/2009)  Problem # 2:  DIABETES MELLITUS, TYPE II (ICD-250.00) due for labs  His updated medication list for this problem includes:    Metformin Hcl 500 Mg Tabs (Metformin hcl) .Marland Kitchen... Take one tablet by mouth twice daily    Aspirin 81 Mg Tbec  (Aspirin) .Marland Kitchen... Take 1 tablet by mouth once a day    Diovan Hct 320-25 Mg Tabs (Valsartan-hydrochlorothiazide) ..... One by mouth daily  Orders: TLB-A1C / Hgb A1C (Glycohemoglobin) (83036-A1C)  Labs Reviewed: Creat: 1.0 (03/06/2009)     Last Eye Exam: normal (04/22/2008) Reviewed HgBA1c results: 6.8 (03/06/2009)  6.8 (11/05/2008)  Problem # 3:  COPD (ICD-496) patient elected to use symbicort  "as needed" , feels well,since there was a question  of overtreatment of his chronic symptoms w/  symbicort (see last pulmonary note) , I'll leave that "as needed" His updated medication list for this problem includes:    Symbicort 160-4.5 Mcg/act Aero (Budesonide-formoterol fumarate) .Marland Kitchen... 2 puffs first thing  in am and 2 puffs again in pm about 12 hours later  Problem # 4:  HYPOTHYROIDISM NOS (ICD-244.9) labs  His updated medication list for this problem includes:    Synthroid 175 Mcg Tabs (Levothyroxine sodium) .Marland Kitchen... Take one tabet daily  Orders: Venipuncture (65784) TLB-TSH (Thyroid Stimulating Hormone) (84443-TSH)  Problem # 5:  CBC, ABNORMAL (ICD-790.99) last WBC elevated, recheck  Orders: TLB-CBC Platelet - w/Differential (85025-CBCD)  Complete Medication List: 1)  Metformin Hcl 500 Mg Tabs (Metformin hcl) .... Take one tablet by mouth twice daily 2)  Niacin Cr 1000 Mg Tbcr (Niacin) .... 2 by mouth qd 3)  Gemfibrozil 600 Mg Tabs (Gemfibrozil) .... Take one tablet twice daily 4)  Synthroid 175 Mcg Tabs (Levothyroxine sodium) .... Take one tabet daily 5)  Centrum Tabs (Multiple vitamins-minerals) .... Take 1 tablet by mouth once a day 6)  Aspirin 81 Mg Tbec (Aspirin) .... Take 1 tablet by mouth once a day 7)  Onetouch Ultra Test Strp (Glucose blood) .... Check blood sugar as directed. 8)  Diovan Hct 320-25 Mg Tabs (Valsartan-hydrochlorothiazide) .... One by mouth daily 9)  Symbicort 160-4.5 Mcg/act Aero (Budesonide-formoterol fumarate) .... 2 puffs first thing  in am and 2 puffs  again in pm about 12 hours later  Other Orders: TLB-ALT (SGPT) (84460-ALT) TLB-AST (SGOT) (84450-SGOT)  Patient Instructions: 1)  Please schedule a follow-up appointment in 5 months .

## 2010-04-22 NOTE — Progress Notes (Signed)
Summary: results  Phone Note Outgoing Call Call back at [facility] Phone [phone]   Details for Reason: LAB RESULTS:  - DM used to be better, improve diet & exercise  - increase metformin 500mg  to 1 1/2 tab two times a day  - TSH elevated:  if compliant with synthroid increase to .  if poor compliance, take synthroid daily  - rov 3 months instead of 5 months  Summary of Call: left message with wife to have pt return call Shary Decamp  July 15, 2009 10:26 AM discussed with pt Shary Decamp  July 15, 2009 11:33 AM     New/Updated Medications: METFORMIN HCL 500 MG  TABS (METFORMIN HCL) 1 1/2 by mouth two times a day SYNTHROID 200 MCG TABS (LEVOTHYROXINE SODIUM) 1 daily - due office visit July 2011 Prescriptions: SYNTHROID 200 MCG TABS (LEVOTHYROXINE SODIUM) 1 daily - due office visit July 2011  #90 x 0   Entered by:   Shary Decamp   Authorized by:   Nolon Rod. Paz MD   Signed by:   Shary Decamp on 07/15/2009   Method used:   Electronically to        [facility]. [id number]* (retail)       [address]       [place], [place]  [address]       Ph: [phone] or [phone]       Fax: [phone]   RxID:   [id number]

## 2010-05-06 ENCOUNTER — Encounter: Payer: Self-pay | Admitting: Internal Medicine

## 2010-05-07 ENCOUNTER — Telehealth: Payer: Self-pay | Admitting: Internal Medicine

## 2010-05-11 ENCOUNTER — Other Ambulatory Visit: Payer: Self-pay | Admitting: Internal Medicine

## 2010-05-11 ENCOUNTER — Ambulatory Visit (INDEPENDENT_AMBULATORY_CARE_PROVIDER_SITE_OTHER): Payer: Medicare Other

## 2010-05-11 ENCOUNTER — Encounter (INDEPENDENT_AMBULATORY_CARE_PROVIDER_SITE_OTHER): Payer: Self-pay | Admitting: *Deleted

## 2010-05-11 DIAGNOSIS — E039 Hypothyroidism, unspecified: Secondary | ICD-10-CM

## 2010-05-11 LAB — TSH: TSH: 2.66 u[IU]/mL (ref 0.35–5.50)

## 2010-05-12 ENCOUNTER — Telehealth: Payer: Self-pay | Admitting: Internal Medicine

## 2010-05-18 NOTE — Assessment & Plan Note (Signed)
Summary: Med rec per MD and TSH check 244.9/see phone note/kb  Medications Added NIACIN CR 500 MG CR-TABS (NIACIN) 4 tabs daily       Nurse Visit   Allergies: No Known Drug Allergies  Orders Added: 1)  Venipuncture [24401] 2)  Specimen Handling [99000] 3)  TLB-TSH (Thyroid Stimulating Hormone) [84443-TSH] 4)  Specimen Handling [99000] 5)  Est. Patient Level I [02725]

## 2010-05-18 NOTE — Progress Notes (Signed)
Summary: 1 day left of med  Phone Note Refill Request   Refills Requested: Medication #1:  SYNTHROID 200 MCG TABS 1 daily Pt only has 1 day left of med pls advise on labs to determine if pt needs to continue on current dose or change.Pt needs 30 day supply sent to [facility] [address] Pls call Pt when Rx sent. Pls advise........Marland KitchenFelecia Deloach CMA  May 12, 2010 11:06 AM   Caller: Patient  Follow-up for Phone Call        same dose, call #[phone], 6 RF Derotha Fishbaugh E. Deanette Tullius MD  May 12, 2010 2:04 PM   Pt only needs 30 day supply because he is going to pay out of pocket for med. Pt will resume 3 month supply at next refill when insurance will cover med. Pt has refill on file at pharmacy for 3 month supply.Marland KitchenMarland KitchenMarland KitchenFelecia Deloach CMA  May 12, 2010 3:28 PM     Prescriptions: SYNTHROID 200 MCG TABS (LEVOTHYROXINE SODIUM) 1 daily  #30 x 0   Entered by:   Jeremy Johann CMA   Authorized by:   Nolon Rod. Zyah Gomm MD   Signed by:   Jeremy Johann CMA on 05/12/2010   Method used:   Faxed to ...       [facility]  [address] [id number] (retail)       [address]       [place], [place]  [address]       Ph: [phone] or [phone]       Fax: [phone]   RxID:   [id number]

## 2010-05-18 NOTE — Progress Notes (Signed)
Summary: Synthroid issue  Phone Note Refill Request Message from:  Patient on May 07, 2010 3:06 PM  Refills Requested: Medication #1:  SYNTHROID 200 MCG TABS 1 daily Patient left message on triage requesting a call about the above. I spoke with him and he states that the pharmacy will not fill his med. I called the pharmacy and the patient was given #90 on 08/1, 10/17, 12/2. I spoke with the patient and gave him those dates. He thinks that maybe he is not being given #90 each time. He denies possibly losing or taking to many pills. Patient insurance will not allow him to get prescription today and patient does not want to pay out of pocked for it. He thinks that it is ok for him to do without it. Please advise.  Initial call taken by: Lucious Groves CMA,  May 07, 2010 3:06 PM  Follow-up for Phone Call        nurse visit--- bring all his med bottles and check a TSH Kenyatta Gloeckner E. Tricia Oaxaca MD  May 10, 2010 2:28 PM   Additional Follow-up for Phone Call Additional follow up Details #1::        Busy x 2. Lucious Groves CMA  May 10, 2010 2:57 PM   Tried again, and patient notes that he will come in. Lucious Groves CMA  May 10, 2010 3:21 PM

## 2010-05-20 ENCOUNTER — Encounter: Payer: Self-pay | Admitting: Internal Medicine

## 2010-06-08 NOTE — Letter (Signed)
Summary:   no diabetic retinopathy----Ophthalmology  Eye Exam/[facility]   Imported By: Maryln Gottron 05/25/2010 13:54:41  _____________________________________________________________________  External Attachment:    Type:   Image     Comment:   External Document

## 2010-06-09 ENCOUNTER — Other Ambulatory Visit: Payer: Self-pay | Admitting: Internal Medicine

## 2010-06-24 ENCOUNTER — Encounter: Payer: Self-pay | Admitting: Internal Medicine

## 2010-06-24 ENCOUNTER — Ambulatory Visit (INDEPENDENT_AMBULATORY_CARE_PROVIDER_SITE_OTHER): Payer: Medicare Other | Admitting: Internal Medicine

## 2010-06-24 DIAGNOSIS — J449 Chronic obstructive pulmonary disease, unspecified: Secondary | ICD-10-CM

## 2010-06-24 DIAGNOSIS — E119 Type 2 diabetes mellitus without complications: Secondary | ICD-10-CM

## 2010-06-24 DIAGNOSIS — I10 Essential (primary) hypertension: Secondary | ICD-10-CM

## 2010-06-24 DIAGNOSIS — E039 Hypothyroidism, unspecified: Secondary | ICD-10-CM

## 2010-06-24 DIAGNOSIS — E785 Hyperlipidemia, unspecified: Secondary | ICD-10-CM

## 2010-06-24 LAB — BASIC METABOLIC PANEL
BUN: 18 mg/dL (ref 6–23)
Creatinine, Ser: 0.9 mg/dL (ref 0.4–1.5)
GFR: 83.68 mL/min (ref >60.00)

## 2010-06-24 NOTE — Assessment & Plan Note (Signed)
Good compliance and amb CBGs

## 2010-06-24 NOTE — Progress Notes (Signed)
  Subjective:    Patient ID: Kenneth Chan, male    DOB: 1930-06-08, 77 y.o.   MRN: [id number]  HPI Doing well Ambulatory CBCGs  In the 80 to 105  Past Medical History  Diagnosis Date  . COPD (chronic obstructive pulmonary disease)     on night 02, PFT's 10/17/2008 FEV1 1.31. (62%) ratio 36 -HFA 50% January 31,2011  . DM type 2 (diabetes mellitus, type 2)   . Hyperlipidemia   . Hypothyroidism   . Hypertension     DC ace 02/09/09 (cough) > resolved  . Diverticulosis of colon   . Gallstones    Past Surgical History  Procedure Date  . Inguinal hernia repair   . Cataract extraction      Review of Systems  Respiratory:       Good compliance w/ night O2 Cough at baseline Minimal sputum production  Cardiovascular: Negative for chest pain, palpitations and leg swelling.  Genitourinary: Negative for dysuria and urgency. Frequency: occ urinary frecuency.       Objective:   Physical Exam  Constitutional: He appears well-developed and well-nourished.  Cardiovascular: Normal rate, regular rhythm and normal heart sounds.   Pulmonary/Chest: Effort normal. No respiratory distress. He has no wheezes. He has no rales.       BS slt decreased  Musculoskeletal: He exhibits no edema.          Assessment & Plan:

## 2010-06-24 NOTE — Assessment & Plan Note (Signed)
Doing well , cost of meds is a major issue, samples if available

## 2010-06-24 NOTE — Assessment & Plan Note (Signed)
No change 

## 2010-06-24 NOTE — Assessment & Plan Note (Signed)
Good control, no change. 

## 2010-07-09 ENCOUNTER — Other Ambulatory Visit: Payer: Self-pay | Admitting: Internal Medicine

## 2010-08-06 NOTE — Op Note (Signed)
NAME:  Kenneth Chan, Kenneth Chan NO.:  [id number]   MEDICAL RECORD NO.:  [id number]                   PATIENT TYPE:  AMB   LOCATION:  DSC                                  FACILITY:  [facility]   PHYSICIAN:  Velora Heckler, M.D.                DATE OF BIRTH:  08/12/1930   DATE OF PROCEDURE:  02/05/2002  DATE OF DISCHARGE:                                 OPERATIVE REPORT   PREOPERATIVE DIAGNOSIS:  Reducible right inguinal hernia.   POSTOPERATIVE DIAGNOSIS:  Reducible right inguinal hernia.   OPERATION PERFORMED:  Repair of right inguinal hernia with Prolene mesh.   SURGEON:  Velora Heckler, M.D.   ANESTHESIA:  General.   ESTIMATED BLOOD LOSS:  Minimal.   PREPARATION:  Betadine.   COMPLICATIONS:  None.   INDICATIONS FOR PROCEDURE:  The patient is a 77 year old white male who  presented for evaluation of abdominal pain, gallstones and right inguinal  hernia.  The patient was evaluated. He had a reducible right inguinal hernia  on physical exam.  He now comes to surgery for repair.   DESCRIPTION OF PROCEDURE:  The procedure was done in operating room #1 at  the [facility].  The patient was brought to the operating  room and placed in supine position on the operating room table.  Following  administration of general anesthesia, the patient was positioned and then  prepped and draped in the usual strict aseptic fashion.  After ascertaining  that an adequate level of anesthesia had been obtained, a right inguinal  incision was made with a #10 blade.  Dissection was carried through the  subcutaneous tissues and hemostasis obtained with the electrocautery.  External oblique fascia was incised in line with its fibers and extended  through the external inguinal ring.  Spermatic cord structures were  encircled with a Penrose drain.  There was a large hernia sac present.  This  appears to originate from the external inguinal ring.  It contained  sigmoid  colon.  It was reduced back within the peritoneal cavity and the sac  dissected up to the level of the internal inguinal ring away from the cord  structures.  It was held in reduction with interrupted 3-0 Vicryl sutures  which effectively decreased the diameter of the internal inguinal ring.  Next, the floor of the inguinal canal was recreated with a sheet of Prolene  mesh.  The mesh was cut to the appropriate dimensions.  It was secured to  the pubic tubercle and along the inguinal ligament with a running 2-0  Novofil suture.  The mesh was split to accommodate the cord structures.  Superior margin of the mesh was secured to the transversalis and internal  oblique fascia with a running 3-0 Novofil suture.  Tails of the mesh were  overlapped and the inferior edges were secured to the inguinal ligament  lateral  to the cord structures to recreate the internal inguinal ring.  Good  hemostasis was noted.  Local field block was placed with Marcaine.  Cord  structures were returned to the inguinal canal.  The external oblique fascia  was closed with interrupted 2-0 Vicryl sutures.  Subcutaneous tissues were  closed with interrupted 3-0 Vicryl sutures.  Skin edges were anesthetized  with local anesthetic.  Skin edges were reapproximated with a running  4-0 Vicryl subcuticular suture.  The wound was washed and dried and benzoin  and Steri-Strips were applied.  Sterile gauze dressings were applied.  The  patient was awakened from anesthesia and brought to the recovery room in  stable condition.  The patient tolerated the procedure well.                                                 Velora Heckler, M.D.    TMG/MEDQ  D:  02/05/2002  T:  02/05/2002  Job:  [id number]   cc:   Thelma Barge P. Modesto Charon, M.D.

## 2010-08-06 NOTE — Consult Note (Signed)
NAME:  Kenneth Chan, Kenneth Chan NO.:  [id number]   MEDICAL RECORD NO.:  [id number]          PATIENT TYPE:  INP   LOCATION:  6742                         FACILITY:  [facility]   PHYSICIAN:  Kenneth Chan, M.D. Romualdo Bolk OF BIRTH:  April 09, 1930   DATE OF CONSULTATION:  01/29/2005  DATE OF DISCHARGE:  01/29/2005                                   CONSULTATION   PULMONARY CONSULTATION   REQUESTING PHYSICIAN:  Kenneth Chan, M.D.   REASON FOR CONSULTATION:  COPD, pneumonia, hypoxemia.   HISTORY OF PRESENT ILLNESS:  Kenneth Chan is a 77 year old gentleman. He has  been seen by Kenneth Chan in the past for COPD. He cannot further characterize  his lung problems; and states specifically that he has seen Kenneth Chan for  lung troubles. In the past he has been prescribed Advair, which he does  not use irregularly. He also has had a rescue inhaler which he does not use  regularly. At his baseline he has minimal exertional dyspnea and no cough or  sputum production. He is able to do all activities of daily living. He  presented on 01/26/2005 with sudden onset of left-sided pleuritic chest  pain, cough, fever, and leukocytosis. Chest x-ray showed a lingular  infiltrate and he was admitted with a diagnosis of pneumonia. He has been  treated with Advair, Combivent, ceftriaxone, Azithromycin and oxygen. He  feels better than when he presented initially. He was found to be hypoxic,  today, I believe while ambulating. He was also noted to have nonspecific  abnormalities on his EKG which prompted a cardiology consultation.  This is  what Kenneth Chan wanted to consult me. By cardiac enzymes, he has ruled out for  myocardial infarction.   PAST MEDICAL HISTORY:  1.  Hypertension.  2.  Diabetes.  3.  Hypothyroidism.  4.  Cataracts.  5.  Benign prostatic hypertrophy.  6.  Diverticulosis.  7.  Bilateral inguinal hernia repair.  8.  COPD.   CHRONIC MEDICATIONS:  1.  Aspirin.  2.  Gemfibrozil  3.   Synthroid.  4.  Captopril.  5.  Hydrochlorothiazide.  6.  Niacin.  7.  Metformin.   CURRENT MEDICATIONS:  These have been reviewed and also include:  Ceftriaxone, azithromycin, Advair, nebulized bronchodilators.   SOCIAL HISTORY:  He smoked 1-1/2 pack of cigarettes per day for almost 50  years; and quit 8 years ago. No history of significant occupational  exposures.   FAMILY HISTORY:  Noncontributory.   REVIEW OF SYSTEMS:  As per the history present illness. In addition, he  denies unexplained weight loss, hemoptysis, lower extremity edema and calf  tenderness.   PHYSICAL EXAMINATION:  VITAL SIGNS:  Afebrile normal vital signs. Oxygen  saturation is 90% on 2 liters at rest.  GENERAL:  He is well-developed, well-nourished in no acute cardiac  respiratory distress.  HEAD AND NECK EXAM:  Revealed no acute abnormalities. There is no jugular  venous distension.  CHEST EXAMINATION:  Reveals hyperresonance to percussion. Breath sounds are  moderately diminished without wheezes or other adventitious sounds.  CARDIAC EXAM:  Reveals  a regular rate and rhythm with no murmurs.  ABDOMEN:  Soft, nontender with normal bowel sounds. Liver and spleen are not  palpable.  EXTREMITIES:  Without clubbing, cyanosis or edema.  NEUROLOGIC EXAM:  Without focal deficits.   DATA:  Chest x-ray reveals changes of COPD with hyperinflation and  interstitial prominence. Also an acute-appearing infiltrate in the lingula.  No evidence of congestive heart failure. Laboratory data has been reviewed.  White blood cell count was 14,900 with a left shift on admission.  Chemistries are unremarkable. Blood cultures are negative to date. Cardiac  panel has been negative on several occasions. Most recent EKG reveals sinus  tachycardia with a ventricular rate of 106 and no other significant  abnormalities.   IMPRESSION:  1.  Chronic obstructive pulmonary disease -- I suspect that it is at least      moderate based  on its radiographic appearance. He is extremely well-      compensated at his baseline, and usually does well without any      bronchodilator therapy.  2.  Acute febrile illness with shortness of breath and pleuritic chest pain      -- radiograph is indeed consistent with a lingular pneumonia; as is his      clinical presentation. I concur with current antibiotic choices up.  3.  Nonspecific EKG changes which prompted a cardiology evaluation -- it is      my opinion that his presentation is not suggestive of an acute cardiac      event. I suspect all of the present illness can be explained by      pneumonia  4.  Hypoxemia -- it is unclear to me whether he needs oxygen therapy at      baseline, but certainly does not now, until this pneumonia resolves      sufficiently. On occasions I will send patient's home with short-term      oxygen therapy.   PLAN/RECOMMENDATIONS:  I concur with his current antibiotics and  bronchodilator therapy. In fact, I have cut back on the frequency of  nebulized bronchodilators as I do not believe that these need to be given  every 4 hours, for him, at the present time. I would discharge him home on  Advair 1 inhalation q.12 h. and Combivent as needed. He might need oxygen  therapy at least in the short-term, prior to discharge. I will defer to  cardiology as to whether further cardiac evaluation is indicated here. A  noninvasive approach might be appropriate given the atypical history  and chest pain. Lastly, I will let Kenneth Chan know that he is here; and if she  is still here on Monday, Kenneth Chan can swing by and say hello. If he is  discharged, I would suggest that he complete a 10-day course of antibiotics  with Avelox or Levaquin.  He should be instructed to followup with Kenneth Chan  within 2-3 weeks after discharge.           ______________________________  Kenneth Chan, M.D. [facility]    DBS/MEDQ  D:  01/28/2005  T:  01/30/2005  Job:  [id number]   cc:    Kenneth Chan, M.D.  [address]  [address]  [place]  [place] [address]   Kenneth Chan, M.D. [facility]  [address]  [place]  [place] [address]   Dr. Rito Ehrlich

## 2010-08-06 NOTE — Consult Note (Signed)
NAME:  ANDREA, COLGLAZIER NO.:  [id number]   MEDICAL RECORD NO.:  [id number]          PATIENT TYPE:  INP   LOCATION:  6742                         FACILITY:  [facility]   PHYSICIAN:  Pricilla Riffle, M.D.    DATE OF BIRTH:  1931-01-17   DATE OF CONSULTATION:  01/28/2005  DATE OF DISCHARGE:  01/29/2005                                   CONSULTATION   IDENTIFYING INFORMATION/JUSTIFICATION FOR ADMISSION AND CARE:  Mr. Yadav is a  77 year old gentleman who we are asked to see regarding chest pain and EKG  changes.   HISTORY OF PRESENT ILLNESS:  The patient has no known history of coronary  artery disease. He was admitted on January 26, 2005 for evaluation of fever,  chills, and chest pain. The patient says that on Tuesday night prior to  admission, feeling like he was taking a cold. He woke up in the middle of  the night with some chest pain, left sided, non-pleuritic, not associated  with shortness of breath. Lasted about 30 minutes. Went away on its own.  None prior and none since. Went to primary care physician on Wednesday and  was sent to the emergency room for respiratory infection. The patient has no  known coronary artery disease. He has a history of a chronic cough. Cutting  trees last week before admission without a problem. No chest pain.   ALLERGIES:  None.   CURRENT MEDICATIONS:  1.  Albuterol 2 puffs q.d.  2.  Aspirin 81 mg q.d.  3.  Zithromax 500 q.24.  4.  Captopril 100 t.i.d.  5.  Rocephin 1 gram q.24.  6.  Advair 1 puffs b.i.d.  7.  Gemfibrozil 600 b.i.d.  8.  Hydrochlorothiazide 25 q.d.  9.  Insulin.  10. Levothyroxine 125 mcg q.d.  11. Metformin 500 b.i.d.  12. Niacin 2 grams q.d.   PAST MEDICAL HISTORY:  1.  Hypertension.  2.  COPD, followed by Dr. Sherene Sires.  3.  Diabetes x7 to 8 years. Reports good control.  4.  History of hypothyroidism.  5.  History of cataracts.  6.  History of BPH.  7.  History of diverticulosis.   SOCIAL HISTORY:  The  patient lives in [place]. He is married. He stopped  smoking years ago. Does not drink. Says that he is active outside.   FAMILY HISTORY:  Mother died of CVA at 17. Father is 58. No known artery  disease. One sister with hypertension.   REVIEW OF SYSTEMS:  Chronic cough with chest pain. Otherwise, all systems  reviewed. Negative as to the above problem except as noted.   PHYSICAL EXAMINATION:  GENERAL:  The patient currently in minimal distress.  VITAL SIGNS:  Blood pressure 106/54, pulse 103, O2 saturation on 2 liters  88% to 91%, on room air 82%.  HEENT:  Normocephalic and atraumatic. PERRL.  NECK:  No bruits.  CARDIAC:  Regular rate and rhythm. S1 and S2. No S3, no S4, and no murmurs.  LUNGS:  Markedly decreased air flow bilaterally.  ABDOMEN:  Supple. No hepatosplenomegaly.  EXTREMITIES:  Good  distal pulses. No edema.   LABORATORY DATA:  A 12 lead EKG on January 28, 2005 - sinus tachycardia  106. Slight ST depression 0.5 mm in 2, 3 and F. January 26, 2005 sinus  tachycardia 114, 0.5 mm ST depression leads 3, F, V5, and V6.   Chest x-ray:  Lingular pneumonia and COPD.   Significant for hemoglobin of 11.6, BUN and creatinine of 14 and 1.3. CK MB  and troponin's negative x3. ABG on 1 liters:  86% saturation, pO2 of 27,  pCO2 of 41, and pH of 7.35.   IMPRESSION:  The patient is a 77 year old with chronic obstructive pulmonary  disease and diabetes admitted January 26, 2005 with fever, chills, and chest  pain. Found to have a left lingular pneumonia. The patient has remained  intermittently hypoxic since admission. Complains of chest pain prior to  admission 30 minutes, non-pleuritic. None since then. Examination  significant for decreased air flow. EKG significant for sinus tachycardia,  probably secondary to pulmonary issues with slight ST depression inferiorly.   Chest pain prior to admission may reflect pulmonary process. Granted, the  patient needs cardiac evaluation at  some point. Currently, there is no  evidence of reactive ischemia. Would defer until pulmonary process is  improved and resolved. Have asked pulmonary service to see the patient,  since he was followed by Dr. Sherene Sires. Continue telemetry.           ______________________________  Pricilla Riffle, M.D.     PVR/MEDQ  D:  01/28/2005  T:  01/30/2005  Job:  [id number]

## 2010-08-06 NOTE — H&P (Signed)
NAME:  Kenneth Chan, Kenneth Chan NO.:  [id number]   MEDICAL RECORD NO.:  [id number]          PATIENT TYPE:  INP   LOCATION:  1827                         FACILITY:  [facility]   PHYSICIAN:  Jackie Plum, M.D.DATE OF BIRTH:  04/13/1930   DATE OF ADMISSION:  03/15/2005  DATE OF DISCHARGE:                                HISTORY & PHYSICAL   CHIEF COMPLAINT:  Chest pain.   Patient is a 77 year old gentleman who presents with five-day history of  right lower chest wall pain, left lower chest wall pain which has been  waxing and waning over the last few days.  Pain is described as tightness  without any nausea and vomiting, shortness of breath, diaphoresis.  Pain is  worsened by coughing or deep breath..  There is no known alleviating factor.  Pain was said to be moderate in intensity.  On a scale of 1-10, pain was  said to be 2 to 3 out of 10.  He saw his PCP in follow-up and was referred  to the ED.  He denied any history of chills, but admits to subjective fever  with cough which is nonproductive.  No ankle swelling or calf or leg pain.  He does not have any abdominal pain, diarrhea, dysuria, frequency,  micturition.   PAST MEDICAL HISTORY:  1.  History of hypertension.  2.  Diabetes.  3.  History of angina.  4.  Diverticular disease.  5.  Emphysema.   He does not have any known medication allergies.   MEDICATIONS:  1.  Aspirin 81 daily.  2.  Multivitamin one daily.  3.  Hydrochlorothiazide 25 daily.  4.  Synthroid 175 mcg daily.  5.  Gemfibrozil 600 mg b.i.d.  6.  Advair Diskus.  7.  Captopril 10 mg t.i.d.  8.  Glucophage 500 b.i.d.  9.  He was also on Levaquin 500 daily.   FAMILY HISTORY:  Positive for heart disease.   SOCIAL HISTORY:  Patient does not smoke cigarettes nor drink alcohol.  He is  retired.   REVIEW OF SYSTEMS:  As noted in HPI, otherwise unremarkable.   PHYSICAL EXAMINATION:  VITAL SIGNS:  Temperature 101.6 degrees Fahrenheit,  blood  pressure 145/65, pulse 120 per minute, respirations 24, oxygen  saturation of 92% on supplemental oxygen.  GENERAL:  Patient was in mild respiratory distress.  HEENT:  Normocephalic, atraumatic.  Pupils were equal, round, reactive to  light.  Extraocular movements were intact.  Oropharynx moist.  NECK:  Supple.  No JVD.  LUNGS:  Rhonchi right more than left and occasional.  There was no obvious  crackles appreciated.  CARDIAC:  Patient was tachycardic __________ extrasystoles.  There was no  murmur, no gallop appreciated.  ABDOMEN:  Soft, nontender.  Bowel sounds present.  EXTREMITIES:  No cyanosis.  No edema.  NEUROLOGIC:  Patient was alert and appropriate.   LABORATORIES:  UA was reviewed and was negative for urinary infection.  X-  ray of the chest indicated right lower lobe infiltrate consistent with  pneumonia.  Full final official report is pending.  12-lead EKG showed sinus  tachycardia at 100 to 200 beats per minute with frequent premature  ventricular contractions.  No blood work was obtained by the ED prior to my  evaluation.  I have requested for flu panel, CBC, and chemistries which are  pending.   IMPRESSION:  1.  Right lower lobe community-acquired pneumonia.  2.  Chronic obstructive pulmonary disease.  3.  Sinus tachycardia secondary to diagnosis #1.   PLAN:  Patient will be admitted for IV antibiotics, nebulizers, and  expectorant __________ .  Will follow up on his chemistries, especially his  coronary function, to establish whether to continue his ACE inhibitor and  his Glucophage.  Further recommendations will be made __________ .      Jackie Plum, M.D.  Electronically Signed     GO/MEDQ  D:  03/16/2005  T:  03/16/2005  Job:  [id number]

## 2010-08-06 NOTE — Assessment & Plan Note (Signed)
[facility]                               PULMONARY OFFICE NOTE   Kenneth Chan, Kenneth Chan                       MRN:          [id number]  DATE:12/08/2005                            DOB:          11-Oct-1930    HISTORY OF PRESENT ILLNESS:  A 77 year old white male with moderate obesity  and COPD with an FEV-1 of 64% predicted documented May 19, 2005 with no  significant improvement after bronchodilators, __________ 70%, and  persistent hypoxemia on nocturnal study dated June 2007 where he spent 37  minutes at a saturation less 88%, and, therefore, it was recommended he stay  on oxygen.  He is discouraged that he cannot come off oxygen, but generally  doing well with no significant limitations with activities of daily living,  orthopnea, PND, leg swelling, fevers, chills, sweats, morning headache or  hypersomnolence.   PHYSICAL EXAMINATION:  GENERAL:  He is an obese, ambulatory male who carries  most of his excess weight in his abdominal compartment with a potbelly  appears.  He looks quite healthy.  VITAL SIGNS:  Afebrile.  HEENT:  Unremarkable.  LUNGS:  Lung fields are clear bilateral to auscultation and percussion,  although breath sounds are diminished.  Hoover's sign is positive at mid  inspiration.  HEART:  There is a regular rhythm without murmur, gallop, or rub.  ABDOMEN:  Soft, obese, benign.  EXTREMITIES:  Warm without calf tenderness, cyanosis or clubbing.   Hemoglobin saturation 93% on room air.   IMPRESSION:  Chronic obstructive pulmonary disease that is relatively  moderate in nature and with improving activity tolerance.  At this point, I  believe it would be appropriate to continue Spiriva and encourage the  patient to be more physically activity to try to improve his calorie balance  to where he loses about 12 more pounds.  If he gets down to around 170  pounds, I think it would be reasonable to try to repeat his nocturnal study  to  see if he no longer meets the criteria for oxygen (saturation less than  or equal to 88 for equal to or more than 5 minutes duration).   The patient is now seeing Dr. Blossom Hoops, and he is certainly capable of  managing this patient's COPD.  I will see the patient back here on a p.r.n.  basis.                                   Charlaine Dalton. Sherene Sires, MD, [facility]   MBW/MedQ  DD:  12/08/2005  DT:  12/10/2005  Job #:  [id number]   cc:   Leanne Chang, M.D.

## 2010-08-06 NOTE — H&P (Signed)
NAME:  Kenneth Chan, Kenneth Chan NO.:  [id number]   MEDICAL RECORD NO.:  [id number]          PATIENT TYPE:  INP   LOCATION:  1832                         FACILITY:  [facility]   PHYSICIAN:  Vella Raring, MD DATE OF BIRTH:  06-08-1930   DATE OF ADMISSION:  01/26/2005  DATE OF DISCHARGE:                                HISTORY & PHYSICAL   CHIEF COMPLAINT:  Fevers, chills, left sided chest pain.   HISTORY OF PRESENT ILLNESS:  This is a 77 year old male whose past medical  history is significant for hypertension and diabetes on oral medicine,  history of COPD stated in patient's old medical records, although the  patient denies symptoms of shortness of breath, cough, or history of COPD  exacerbation.  Hypothyroidism status post radioactive iodine.  Ex-smoker.  The patient was feeling well until two days prior to admission.  The patient  states the night before last, the patient awoke with chest pain that was  left sided.  He described this as a chest tightness.  The chest pain lasted  about 20-30 minutes and then subsided.  The chest pain was nonpleuritic in  nature and did not radiate.  There was no associated shortness of breath,  nausea and vomiting, diaphoresis.  The next day, the patient began to get a  cough, the cough was most dry, was not productive of any phlegm.  The  patient said that when he would cough, he would also get a recurrence of the  chest pain.  The following day, the chest pain recurred, was again similar,  left sided, and then dissipated.  The patient states he can still feel a  twinge of pain if he presses hard on his chest.  The patient was also noted  to have fevers and chills at home and feeling aching.  The patient presented  to his primary medical doctor today with the above complaints.  The patient  was found to be febrile, tachypneic, and tachycardic.  The patient was sent  by his primary medical doctor to the emergency room  for admission.   The  patient denies any nausea, vomiting, constipation or diarrhea.  He is eating  well.  The patient states at baseline, he is very typically active.  He has  unlimited exercise tolerance, in fact, the patient states last week he was  trimming his trees and did not have any chest pain or shortness of breath.   PAST MEDICAL HISTORY:  As above, he has a history of hypertension, diabetes,  hypothyroidism, cataracts, BPH, diverticulosis on flexible sigmoidoscopy,  bilateral inguinal herniae, COPD.   PAST SURGICAL HISTORY:  The patient has had hernia repair.  He has had  surgery to his left eye, strabismus surgery.  The patient also had  amputation of a finger on his right many years ago.   SOCIAL HISTORY:  The patient stopped smoking nine years ago.  He denies  alcohol or drug use.  He lives at home.   MEDICATIONS:  Multi-vitamin, aspirin 81 mg daily, Gemfibrozil 600 mg p.o.  b.i.d., Synthroid 175 mcg daily, Captopril 100  mg t.i.d.,  hydrochlorothiazide 25 mg daily, Niaspan 2000 mg at night, Metformin 500 mg  p.o. b.i.d., and omega III fish oil 1000 mg daily.   REVIEW OF SYSTEMS:  The patient denies any headache, dizziness.  No  shortness of breath.  The patient does not have a chronic cough.  He has not  been hospitalized for shortness of breath.  He denies nausea, vomiting,  constipation, diarrhea.  No urinary symptoms.  He is eating well.   PHYSICAL EXAMINATION:  VITAL SIGNS:  Blood pressure 140/75, saturating 93% on 2 liters, pulse 110,  respiratory rate 30.  In the emergency room , the patient had a temperature  of 102.7 orally in the emergency room .  HEENT:  The patient is normocephalic, atraumatic, pupils equal, round,  reactive to light.  HEART:  S1 and S2 were tachycardic, no murmurs, gallops, and rubs  auscultated.  LUNGS:  Decreased breath sounds throughout, no crackles, rhonchi heard.  ABDOMEN:  Normal active bowel sounds, soft, nontender, nondistended.  EXTREMITIES:   The patient had no edema, could move all extremities.   LABORATORY DATA:  White count 14.9, hemoglobin 13.8, hematocrit 40.5,  platelet count 206.  Chem-7 with sodium 138, potassium 4.2, chloride 101,  CO2 24, glucose 129, BUN 14, creatinine 1.3.  Calcium 9.5, total protein  7.5, albumin 3.7.  LFTs within normal limits.  His UA is negative for  nitrites and leukocytes.  The patient also had rare bacteria.  The patient  had chest x-ray done, questionable COPD and chronic interstitial lung  changes, left lingular pneumonia.  The patient had blood cultures taken,  results pending.  The patient had an EKG done at his primary medical doctors  office, the patient was sinus tachycardia.  The patient had small Q waves in  leads 3.  The patient was also noted to have slight ST depression in leads  2, 3, and AVF, there was no old EKG to compare to.  The patient had repeat  EKG done in the emergency room, sinus tachycardia at 114.  EKG on January 26, 2005, in the emergency room  showed ST  depression in leads 2, 3.   ASSESSMENT/PLAN:  77 year old male with hypertension, diabetes, and history  of COPD, presenting with pneumonia.  For his pneumonia, we will give him  Ceftriaxone, Zithromax, fluid hydration, given his febrile illness.  Give O2  by nasal cannula to maintain his O2 saturations in the low 90s, 90-93%.  Follow up his blood cultures.  The patient is not producing sputum, does not  need any respiratory cultures.  Will also send urine Legionella antigen.  The patient also has some ST depression on EKG, likely precipitated by  pneumonia and sinus tachycardia, admit to telemetry, have serial EKGs.  We  will get cardiac enzymes x 3.  Continue aspirin 81 mg p.o. daily.  The  patient will be placed on sliding scale insulin, the patient will continue  on Metformin.  The patient will be continued on his Synthroid.  The patient will be continued on Gemfibrozil and Niaspan.            ______________________________  Vella Raring, MD     LNB/MEDQ  D:  01/26/2005  T:  01/26/2005  Job:  [id number]

## 2010-08-06 NOTE — Discharge Summary (Signed)
NAME:  Kenneth Chan, Kenneth Chan NO.:  [id number]   MEDICAL RECORD NO.:  [id number]          PATIENT TYPE:  INP   LOCATION:  3732                         FACILITY:  [facility]   PHYSICIAN:  Jackie Plum, M.D.DATE OF BIRTH:  November 26, 1930   DATE OF ADMISSION:  03/15/2005  DATE OF DISCHARGE:  03/18/2005                                 DISCHARGE SUMMARY   DISCHARGE DIAGNOSIS:  1.  Right lower lobe pneumonia, improved.  2.  Chronic obstructive pulmonary disease exacerbation, resolved.  3.  History of chronic obstructive pulmonary disease, coronary artery      disease, angina, diabetes, and hypertension.   DISCHARGE MEDICATIONS:  The patient is going to resume his aspirin, multi-  vitamin, HCTZ, Synthroid for hypothyroidism, Gemfibrozil for dyslipidemia,  Advair Diskus, Captopril, Glucophage, as previously.  New medicines are  Avelox 100 mg daily, Mucinex 600 mg b.i.d., and Combivent MDI 2 puffs daily.   CONSULTATIONS:  Not applicable.   PROCEDURES:  Not applicable.   CONDITION ON DISCHARGE:  Improved and satisfactory.   DISCHARGE LABORATORY DATA:  WBC 11.7, hemoglobin 11.3, hematocrit 33.1, MCV  94.2, platelet count 340.  Sodium 129, potassium 4.3, chloride 99, CO2 58,  glucose 130, BUN 20, creatinine 1.2, calcium 9.1.  TSH 1.930.   DISPOSITION:  The patient is discharged to home.  The patient will continue  his oxygen 2 liters per minute by nasal cannula at home as previously.   REASON FOR ADMISSION:  Right lower lobe pneumonia.   HISTORY:  The patient presented with chest pain which was noted to be  pleuritic with shortness of breath, diaphoresis.  He has had some subjective  fever.  In the emergency room, the patient was noted to be tachycardic with  a pulse of 120 per minute and in respiratory distress with respiratory rate  of 34.  He had rhonchi on pulmonary auscultation and tachycardic without any  gallops.  X-ray revealed right lower lobe infiltrate, question of  pneumonic  process.  EKG revealed sinus tachycardia with PVCs.  He was, thus, admitted  for further management.   HOSPITAL COURSE:  On admission, the patient was placed on a regimen of  bronchodilator nebulizations, steroids, antibiotics, and supportive care.  He was given pulmonary toilet measures and he started to improve  significantly.  At the time of discharge, the patient's chest pain, which  was pleuritic, had resolved.  He was not in respiratory distress.  He is  breathing fine without any problems.  He is at baseline.  Yesterday on  rounds, he requested that he be discharged but we opted to observe him for  24 hours before discharge on account of his remarkable quick improvement.  On rounds today, the patient denies any fevers, chills, shortness of breath,  chest pain, or dizziness.  His discharge blood pressure was 133/74, oxygen  saturation on 2 liters oxygen by nasal cannula was 94%, his pulse rate was  90 which had come down, respiratory rate was 15.  The patient is  comfortable, looks well, mucous membranes moist.  He does not have any  scleral icterus,  he does have mild pallor.  His pulmonary auscultation  revealed improved air entry with right lower lung field rales.  The patient  is tachycardic to auscultation,  there are no gallops noted.  The abdomen is soft, bowel sounds present.  Extremities with no cyanosis or edema.  The patient is to call his primary  care physician, Dr. Modesto Charon, for an appointment to be obtained in 2-3 weeks.  He will need repeat chest x-ray to make sure that his pneumonic process has  cleared.      Jackie Plum, M.D.  Electronically Signed     GO/MEDQ  D:  03/18/2005  T:  03/18/2005  Job:  [id number]

## 2010-08-11 ENCOUNTER — Other Ambulatory Visit: Payer: Self-pay | Admitting: Internal Medicine

## 2010-09-15 ENCOUNTER — Encounter: Payer: Self-pay | Admitting: Internal Medicine

## 2010-09-15 ENCOUNTER — Ambulatory Visit (INDEPENDENT_AMBULATORY_CARE_PROVIDER_SITE_OTHER): Payer: Medicare Other | Admitting: Internal Medicine

## 2010-09-15 DIAGNOSIS — L309 Dermatitis, unspecified: Secondary | ICD-10-CM | POA: Insufficient documentation

## 2010-09-15 DIAGNOSIS — L259 Unspecified contact dermatitis, unspecified cause: Secondary | ICD-10-CM

## 2010-09-15 MED ORDER — ACYCLOVIR 400 MG PO TABS: 400.0000 mg | ORAL_TABLET | Freq: Every day | ORAL | Status: AC

## 2010-09-15 MED ORDER — HYDROCORTISONE 2.5 % EX CREA: TOPICAL_CREAM | Freq: Two times a day (BID) | CUTANEOUS | Status: DC

## 2010-09-15 NOTE — Patient Instructions (Signed)
Call if no better in few days  

## 2010-09-15 NOTE — Assessment & Plan Note (Signed)
Lip dermatitis, could be herpetic versus sunburn although no other areas of his face or arms have sunburn. Plan to treat with topical  steroids and acyclovir. See instructions

## 2010-09-15 NOTE — Progress Notes (Signed)
  Subjective:    Patient ID: Kenneth Chan, male    DOB: [birth year], 77 y.o.   MRN: [id number]  HPI 4 days history of lower lip irritation. Started as a blister on the right side of the lip and then spread throughout the rest of the lip. Has some pain in the area but no itching.  Past Medical History  Diagnosis Date  . COPD (chronic obstructive pulmonary disease)     on night 02, PFT's 10/17/2008 FEV1 1.31. (62%) ratio 36 -HFA 50% January 31,2011  . DM type 2 (diabetes mellitus, type 2)   . Hyperlipidemia   . Hypothyroidism   . Hypertension     DC ace 02/09/09 (cough) > resolved  . Diverticulosis of colon   . Gallstones     Past Surgical History  Procedure Date  . Inguinal hernia repair   . Cataract extraction     Review of Systems Reports a history of previous fever blisters. Has been exposed to the sun but not excessively so Denies any throat problems, no tongue or lip swelling. No taken any new medicines.     Objective:   Physical Exam Alert, oriented, in no apparent distress. Oropharynx is normal to examination face: Normal except for the lower lip which has multiple dried blisters, no actual swelling, fluctuance or discharge.       Assessment & Plan:

## 2010-10-26 ENCOUNTER — Encounter: Payer: Self-pay | Admitting: Internal Medicine

## 2010-10-26 ENCOUNTER — Ambulatory Visit (INDEPENDENT_AMBULATORY_CARE_PROVIDER_SITE_OTHER): Payer: Medicare Other | Admitting: Internal Medicine

## 2010-10-26 DIAGNOSIS — E785 Hyperlipidemia, unspecified: Secondary | ICD-10-CM

## 2010-10-26 DIAGNOSIS — J449 Chronic obstructive pulmonary disease, unspecified: Secondary | ICD-10-CM

## 2010-10-26 DIAGNOSIS — E119 Type 2 diabetes mellitus without complications: Secondary | ICD-10-CM

## 2010-10-26 DIAGNOSIS — E039 Hypothyroidism, unspecified: Secondary | ICD-10-CM

## 2010-10-26 DIAGNOSIS — I10 Essential (primary) hypertension: Secondary | ICD-10-CM

## 2010-10-26 LAB — HEMOGLOBIN A1C: Hgb A1c MFr Bld: 6.4 % (ref 4.6–6.5)

## 2010-10-26 LAB — CBC WITH DIFFERENTIAL/PLATELET
Basophils Absolute: 0 10*3/uL (ref 0.0–0.1)
Eosinophils Absolute: 0.1 10*3/uL (ref 0.0–0.7)
Lymphocytes Relative: 20.1 % (ref 12.0–46.0)
Lymphs Abs: 1.2 10*3/uL (ref 0.7–4.0)
MCHC: 33.2 g/dL (ref 30.0–36.0)
Monocytes Relative: 7.3 % (ref 3.0–12.0)
Platelets: 167 10*3/uL (ref 150.0–400.0)
RDW: 14.5 % (ref 11.5–14.6)

## 2010-10-26 LAB — LIPID PANEL
Cholesterol: 120 mg/dL (ref 0–200)
HDL: 52.4 mg/dL (ref >39.00)
VLDL: 10.2 mg/dL (ref 0.0–40.0)

## 2010-10-26 NOTE — Progress Notes (Signed)
  Subjective:    Patient ID: Kenneth Chan, male    DOB: [birth year], 77 y.o.   MRN: [id number]  HPI Routine office visit Hypertension--good medication compliance, reports good ambulatory blood pressures but did not bring his log. COPD--good compliance with oxygen, doing well Hypothyroidism--good medication compliance Diabetes--good medication compliance, ambulatory blood sugars around 90-110. No problems with low blood sugars  Past Medical History  Diagnosis Date  . COPD (chronic obstructive pulmonary disease)     on night 02, PFT's 10/17/2008 FEV1 1.31. (62%) ratio 36 -HFA 50% January 31,2011  . DM type 2 (diabetes mellitus, type 2)   . Hyperlipidemia   . Hypothyroidism   . Hypertension     DC ace 02/09/09 (cough) > resolved  . Diverticulosis of colon   . Gallstones    Past Surgical History  Procedure Date  . Inguinal hernia repair   . Cataract extraction      Review of Systems Denies cough or sputum production today. Actually he states that he is not short of breath. Denies any nausea, vomiting, diarrhea or blood in the stools.    Objective:   Physical Exam  Constitutional: He is oriented to person, place, and time. He appears well-developed and well-nourished. No distress.  Cardiovascular: Normal rate, regular rhythm and normal heart sounds.   No murmur heard. Pulmonary/Chest: Effort normal and breath sounds normal. No respiratory distress. He has no wheezes. He has no rales.  Musculoskeletal: He exhibits no edema.  Neurological: He is alert and oriented to person, place, and time.  Skin: Skin is warm and dry. He is not diaphoretic.  Psychiatric: He has a normal mood and affect. His behavior is normal.          Assessment & Plan:

## 2010-10-26 NOTE — Assessment & Plan Note (Signed)
Seems to be doing well, labs  

## 2010-10-26 NOTE — Assessment & Plan Note (Signed)
No change 

## 2010-10-26 NOTE — Assessment & Plan Note (Signed)
Symptoms very well controlled

## 2010-10-26 NOTE — Assessment & Plan Note (Signed)
Good med compliance, BP great today

## 2010-11-08 ENCOUNTER — Other Ambulatory Visit: Payer: Self-pay | Admitting: Internal Medicine

## 2010-11-08 NOTE — Telephone Encounter (Signed)
Rx[s] Done. 

## 2011-01-04 ENCOUNTER — Other Ambulatory Visit: Payer: Self-pay | Admitting: Internal Medicine

## 2011-01-04 NOTE — Telephone Encounter (Signed)
Done

## 2011-02-08 ENCOUNTER — Other Ambulatory Visit: Payer: Self-pay | Admitting: Internal Medicine

## 2011-02-08 NOTE — Telephone Encounter (Signed)
Ok 90, no RF 

## 2011-02-28 ENCOUNTER — Ambulatory Visit (INDEPENDENT_AMBULATORY_CARE_PROVIDER_SITE_OTHER): Payer: Medicare Other | Admitting: Internal Medicine

## 2011-02-28 ENCOUNTER — Encounter: Payer: Self-pay | Admitting: Internal Medicine

## 2011-02-28 VITALS — BP 120/70 | HR 79 | Temp 97.5°F | Ht 69.0 in | Wt 176.0 lb

## 2011-02-28 DIAGNOSIS — I10 Essential (primary) hypertension: Secondary | ICD-10-CM

## 2011-02-28 DIAGNOSIS — E119 Type 2 diabetes mellitus without complications: Secondary | ICD-10-CM

## 2011-02-28 DIAGNOSIS — J449 Chronic obstructive pulmonary disease, unspecified: Secondary | ICD-10-CM

## 2011-02-28 DIAGNOSIS — E039 Hypothyroidism, unspecified: Secondary | ICD-10-CM

## 2011-02-28 LAB — BASIC METABOLIC PANEL
CO2: 28 mEq/L (ref 19–32)
Chloride: 106 mEq/L (ref 96–112)
Creatinine, Ser: 1 mg/dL (ref 0.4–1.5)
Potassium: 4.8 mEq/L (ref 3.5–5.1)

## 2011-02-28 LAB — TSH: TSH: 1.74 u[IU]/mL (ref 0.35–5.50)

## 2011-02-28 NOTE — Assessment & Plan Note (Signed)
On O2 qhs, had a flu shot

## 2011-02-28 NOTE — Assessment & Plan Note (Signed)
Good compliance, due for labs

## 2011-02-28 NOTE — Patient Instructions (Signed)
Came back fasting in 4 months, fasting for a physical exam

## 2011-02-28 NOTE — Progress Notes (Signed)
  Subjective:    Patient ID: Kenneth Chan, male    DOB: December 27, 1930, 77 y.o.   MRN: [id number]  HPI Routine office visit. He already had a flu shot   Past Medical History  Diagnosis Date  . COPD (chronic obstructive pulmonary disease)     on night 02, PFT's 10/17/2008 FEV1 1.31. (62%) ratio 36 -HFA 50% January 31,2011  . DM type 2 (diabetes mellitus, type 2)   . Hyperlipidemia   . Hypothyroidism   . Hypertension     DC ace 02/09/09 (cough) > resolved  . Diverticulosis of colon   . Gallstones    Past Surgical History  Procedure Date  . Inguinal hernia repair   . Cataract extraction      Review of Systems Med list reviewed, good compliance amb BPs 120/70 Check CBGs on-off, good results, no sx c/w hypogolycemia     Objective:   Physical Exam  Constitutional: He is oriented to person, place, and time. He appears well-developed and well-nourished.  HENT:  Head: Normocephalic and atraumatic.  Cardiovascular: Normal rate, regular rhythm and normal heart sounds.   No murmur heard. Pulmonary/Chest:       Decreased breath sounds otherwise clear  Musculoskeletal: He exhibits no edema.  Neurological: He is alert and oriented to person, place, and time.  Psychiatric: He has a normal mood and affect. His behavior is normal.      Assessment & Plan:

## 2011-02-28 NOTE — Assessment & Plan Note (Signed)
Due for labs

## 2011-02-28 NOTE — Assessment & Plan Note (Signed)
Reports good medication compliance and normal ambulatory BPs. Labs

## 2011-03-01 ENCOUNTER — Encounter: Payer: Self-pay | Admitting: Internal Medicine

## 2011-03-10 ENCOUNTER — Other Ambulatory Visit: Payer: Self-pay | Admitting: Internal Medicine

## 2011-06-08 ENCOUNTER — Encounter: Payer: Self-pay | Admitting: *Deleted

## 2011-06-10 ENCOUNTER — Other Ambulatory Visit: Payer: Self-pay | Admitting: Internal Medicine

## 2011-06-10 NOTE — Telephone Encounter (Signed)
Refill done.  

## 2011-06-29 ENCOUNTER — Ambulatory Visit (INDEPENDENT_AMBULATORY_CARE_PROVIDER_SITE_OTHER): Payer: Medicare Other | Admitting: Internal Medicine

## 2011-06-29 VITALS — BP 136/70 | HR 73 | Temp 97.9°F | Wt 174.0 lb

## 2011-06-29 DIAGNOSIS — J4489 Other specified chronic obstructive pulmonary disease: Secondary | ICD-10-CM

## 2011-06-29 DIAGNOSIS — E039 Hypothyroidism, unspecified: Secondary | ICD-10-CM

## 2011-06-29 DIAGNOSIS — I10 Essential (primary) hypertension: Secondary | ICD-10-CM

## 2011-06-29 DIAGNOSIS — E785 Hyperlipidemia, unspecified: Secondary | ICD-10-CM

## 2011-06-29 DIAGNOSIS — E119 Type 2 diabetes mellitus without complications: Secondary | ICD-10-CM

## 2011-06-29 DIAGNOSIS — H9190 Unspecified hearing loss, unspecified ear: Secondary | ICD-10-CM

## 2011-06-29 DIAGNOSIS — J449 Chronic obstructive pulmonary disease, unspecified: Secondary | ICD-10-CM

## 2011-06-29 DIAGNOSIS — Z0000 Encounter for general adult medical examination without abnormal findings: Secondary | ICD-10-CM

## 2011-06-29 LAB — LIPID PANEL
Cholesterol: 124 mg/dL (ref 0–200)
LDL Cholesterol: 64 mg/dL (ref 0–99)
Total CHOL/HDL Ratio: 2
VLDL: 8.2 mg/dL (ref 0.0–40.0)

## 2011-06-29 NOTE — Assessment & Plan Note (Signed)
Well controlled, samples if available. No change

## 2011-06-29 NOTE — Assessment & Plan Note (Signed)
Well-controlled, no change 

## 2011-06-29 NOTE — Assessment & Plan Note (Signed)
Good compliance with medications, labs 

## 2011-06-29 NOTE — Progress Notes (Addendum)
Subjective:    Patient ID: Kenneth Chan, male    DOB: [birth year], 78 y.o.   MRN: [id number]  HPI Here for Medicare AWV: 1. Risk factors based on Past M, S, F history: reviewed  2. Physical Activities: walks 2 miles 3/week  3. Depression/mood: denies, no problems noted  4. Hearing: pt's wife thinks hearing has decreased , he did have some decreased hearing on                      exam--- will refer to audilogist 5. ADL's: totally independent, still drives  6. Fall Risk: no recent falls, ---counseled (lights at night, uncluttered house, etc) 7. Home Safety: does feel safe at home  8. Height, weight, &visual acuity: see VS, saw the  eye doctor in March 2013 , vision is not as good             as before but ok 9. Counseling:  Yes, recommend to have a medical will  10. Labs ordered based on risk factors: yes  11.           Referral Coordination, if needed  12.           Care Plan, see a/p  13.            Cognitive Assessment : cognition, motor skills and memory seems appropriate  in addition, we discussed the following issues  COPD-- on symbicort only, uses night O2  Diabetes --  Good med compliance, ambulatory CBGs range from 80s to 109s , no low sugar sx   Hyperlipidemia-- good medication compliance , denies flushing  hypothyroidism-- good medication compliance  Hypertension-- ambulatory BPs range from 99/62 to 126/60, mostly numbers are wnl, no sx such as fatigue, dizzy    Past Medical History: Diabetes mellitus, type II Hyperlipidemia hypothyroidism Hypertension     - DC ACE February 09, 2009 (cough) > resolved COPD--on night O2    - PFT's 7/ 30/2010  FEV1 1.31.( 62%)  ratio 36    - HFA 50% April 20, 2009  Diverticulosis, colon gallstones  Past Surgical History: Inguinal herniorrhaphy Cataract extraction  Family History: HTN - M DM - no CAD - M (MI) Stroke - no colon ca - no prostate ca - no  Social History: Married, children x 4 (3 girls ) retired Production designer, theatre/television/film  for a [employer] Tobacco-- quit ~ 2000, used to smoke ~ 1 ppd Alcohol use-no    Review of Systems  Respiratory: Negative for cough and shortness of breath.        No hemptysis  Cardiovascular: Negative for chest pain and leg swelling.  Gastrointestinal: Negative for abdominal pain and blood in stool.  Genitourinary: Negative for dysuria, hematuria and difficulty urinating.  Musculoskeletal: Negative for back pain.       Objective:   Physical Exam  General:  alert, well-developed, and well-nourished.   Neck:  no masses, no thyromegaly, no supra-clavicular LADs HEENT: Tympanic membranes normal, very mild amount of wax noted Lungs:  normal respiratory effort, no intercostal retractions, no accessory muscle use, and   decreased breath sounds.   Heart:  normal rate, regular rhythm, and no murmur.   Abdomen:  soft, non-tender, no distention, no masses, no guarding, and no rigidity. no bruit   Rectal:  No external abnormalities noted. Normal sphincter tone. No rectal masses or tenderness. Prostate:  Prostate gland firm and smooth, no enlargement, nodularity, tenderness, mass, asymmetry or induration. Brown stools, Hemoccult negative  Extremities:  no pretibial edema bilaterally  Psych:  Oriented X3, memory intact for recent and remote, normally interactive, good eye contact, not anxious appearing, and not depressed appearing.         Assessment & Plan:

## 2011-06-29 NOTE — Assessment & Plan Note (Signed)
Compliance with medication, no apparent side effects. Check a FLP

## 2011-06-29 NOTE — Assessment & Plan Note (Addendum)
Well-controlled. Has been taking half Diovan HCT all along. Medications list corrected. EKG today wnl Labs BMP normal.

## 2011-06-30 ENCOUNTER — Encounter: Payer: Self-pay | Admitting: Internal Medicine

## 2011-06-30 DIAGNOSIS — Z0000 Encounter for general adult medical examination without abnormal findings: Secondary | ICD-10-CM | POA: Insufficient documentation

## 2011-06-30 NOTE — Assessment & Plan Note (Signed)
Td  2009, Pneumonia shot 2006, and 2011 , had a  shingles shot 2010  Cscope 06-2006 @ [facility], next Cscope  "as needed". Hemoccult negative today  Counseled about diet, continue exercising as he is doing Prostate cancer screening? DRE today is normal, PSAs have been normal all along, he is asx . No further screening. audiology y referral. See above.

## 2011-07-01 LAB — HEMOGLOBIN A1C: Hgb A1c MFr Bld: 6.5 % (ref 4.6–6.5)

## 2011-07-04 ENCOUNTER — Encounter: Payer: Self-pay | Admitting: *Deleted

## 2011-07-07 ENCOUNTER — Ambulatory Visit: Payer: Medicare Other | Attending: Internal Medicine | Admitting: Audiology

## 2011-07-07 DIAGNOSIS — H918X9 Other specified hearing loss, unspecified ear: Secondary | ICD-10-CM | POA: Insufficient documentation

## 2011-07-20 ENCOUNTER — Encounter: Payer: Self-pay | Admitting: Internal Medicine

## 2011-10-07 ENCOUNTER — Other Ambulatory Visit: Payer: Self-pay | Admitting: Internal Medicine

## 2011-10-07 NOTE — Telephone Encounter (Signed)
Refill done.  

## 2011-12-02 ENCOUNTER — Ambulatory Visit (INDEPENDENT_AMBULATORY_CARE_PROVIDER_SITE_OTHER): Payer: Medicare Other

## 2011-12-02 DIAGNOSIS — Z23 Encounter for immunization: Secondary | ICD-10-CM

## 2011-12-27 ENCOUNTER — Other Ambulatory Visit: Payer: Self-pay | Admitting: Internal Medicine

## 2011-12-27 NOTE — Telephone Encounter (Signed)
Refill done.  

## 2011-12-29 ENCOUNTER — Ambulatory Visit (INDEPENDENT_AMBULATORY_CARE_PROVIDER_SITE_OTHER): Payer: Medicare Other | Admitting: Internal Medicine

## 2011-12-29 ENCOUNTER — Encounter: Payer: Self-pay | Admitting: Internal Medicine

## 2011-12-29 VITALS — BP 126/62 | HR 69 | Temp 98.1°F | Wt 171.0 lb

## 2011-12-29 DIAGNOSIS — J449 Chronic obstructive pulmonary disease, unspecified: Secondary | ICD-10-CM

## 2011-12-29 DIAGNOSIS — E039 Hypothyroidism, unspecified: Secondary | ICD-10-CM

## 2011-12-29 DIAGNOSIS — E119 Type 2 diabetes mellitus without complications: Secondary | ICD-10-CM

## 2011-12-29 DIAGNOSIS — E785 Hyperlipidemia, unspecified: Secondary | ICD-10-CM

## 2011-12-29 DIAGNOSIS — I10 Essential (primary) hypertension: Secondary | ICD-10-CM

## 2011-12-29 LAB — CBC WITH DIFFERENTIAL/PLATELET
Basophils Absolute: 0 10*3/uL (ref 0.0–0.1)
Basophils Relative: 0.1 % (ref 0.0–3.0)
Eosinophils Absolute: 0.2 10*3/uL (ref 0.0–0.7)
Hemoglobin: 13.6 g/dL (ref 13.0–17.0)
Lymphs Abs: 0.9 10*3/uL (ref 0.7–4.0)
MCHC: 32 g/dL (ref 30.0–36.0)
MCV: 99.4 fl (ref 78.0–100.0)
Monocytes Absolute: 0.4 10*3/uL (ref 0.1–1.0)
Neutro Abs: 4 10*3/uL (ref 1.4–7.7)
RBC: 4.26 Mil/uL (ref 4.22–5.81)
RDW: 13.8 % (ref 11.5–14.6)

## 2011-12-29 LAB — BASIC METABOLIC PANEL
BUN: 16 mg/dL (ref 6–23)
Chloride: 104 mEq/L (ref 96–112)
Creatinine, Ser: 0.9 mg/dL (ref 0.4–1.5)
Glucose, Bld: 112 mg/dL — ABNORMAL HIGH (ref 70–99)
Potassium: 4.4 mEq/L (ref 3.5–5.1)

## 2011-12-29 LAB — AST: AST: 17 U/L (ref 0–37)

## 2011-12-29 NOTE — Assessment & Plan Note (Signed)
Doing well, feet exam negative Check an A1c

## 2011-12-29 NOTE — Progress Notes (Signed)
  Subjective:    Patient ID: Kenneth Chan, male    DOB: Oct 19, 1930, 78 y.o.   MRN: [id number]  HPI  ROV Hypertension, good medication compliance, ambulatory BPs are checked frequently, all numbers are around 120/78 Diabetes, good medication compliance, CBGs att mid morning range from 70-101. COPD, well-controlled, good medication compliance, denies cough or shortness of breath.  Past Medical History: Diabetes mellitus, type II Hyperlipidemia hypothyroidism Hypertension     - DC ACE February 09, 2009 (cough) > resolved COPD--on night O2    - PFT's 7/ 30/2010  FEV1 1.31.( 62%)  ratio 36    - HFA 50% April 20, 2009   Diverticulosis, colon gallstones  Past Surgical History: Inguinal herniorrhaphy Cataract extraction  Family History: HTN - M DM - no CAD - M (MI) Stroke - no colon ca - no prostate ca - no  Social History: Married, children x 4 (3 girls ) retired Production designer, theatre/television/film for a [employer] Tobacco-- quit ~ 2000, used to smoke ~ 1 ppd Alcohol use-no  Review of Systems Denies pain or burning on his feet. No symptoms consistent with low blood sugar Denies nausea, vomiting, diarrhea. No palpitations or lower extremity edema.     Objective:   Physical Exam  General -- alert, well-developed, and well-nourished.   Lungs -- decreased breath sounds otherwise normal Heart-- normal rate, regular rhythm, no murmur, and no gallop.   Abdomen--soft, non-tender, no distention, no masses, no HSM, no guarding, and no rigidity.   DIABETIC FEET EXAM: No lower extremity edema Normal pedal pulses bilaterally Skin and nails are normal without calluses Pinprick examination of the feet normal. Psych-- Cognition and judgment appear intact. Alert and cooperative with normal attention span and concentration.  not anxious appearing and not depressed appearing.      Assessment & Plan:

## 2011-12-29 NOTE — Assessment & Plan Note (Signed)
Good compliance with medications, samples provided, had a flu shot already. Doing well.

## 2011-12-29 NOTE — Assessment & Plan Note (Signed)
good medication compliance Labs  

## 2011-12-29 NOTE — Assessment & Plan Note (Signed)
good medication compliance Last FLP very good, no change

## 2011-12-29 NOTE — Assessment & Plan Note (Signed)
Very good ambulatory BPs. Check a BMP. Otherwise no change.

## 2012-01-03 ENCOUNTER — Encounter: Payer: Self-pay | Admitting: *Deleted

## 2012-01-05 ENCOUNTER — Other Ambulatory Visit: Payer: Self-pay | Admitting: Internal Medicine

## 2012-01-05 NOTE — Telephone Encounter (Signed)
Rx sent.    MW 

## 2012-05-04 ENCOUNTER — Other Ambulatory Visit: Payer: Self-pay | Admitting: Internal Medicine

## 2012-05-17 ENCOUNTER — Other Ambulatory Visit: Payer: Self-pay | Admitting: *Deleted

## 2012-05-17 MED ORDER — LEVOTHYROXINE SODIUM 200 MCG PO TABS: ORAL_TABLET | ORAL | Status: DC

## 2012-05-17 NOTE — Telephone Encounter (Signed)
Rx sent 

## 2012-06-04 ENCOUNTER — Encounter: Payer: Self-pay | Admitting: Internal Medicine

## 2012-06-28 ENCOUNTER — Ambulatory Visit (INDEPENDENT_AMBULATORY_CARE_PROVIDER_SITE_OTHER): Payer: Medicare Other | Admitting: Internal Medicine

## 2012-06-28 ENCOUNTER — Encounter: Payer: Self-pay | Admitting: Internal Medicine

## 2012-06-28 VITALS — BP 120/64 | HR 65 | Temp 98.1°F | Ht 68.0 in | Wt 172.0 lb

## 2012-06-28 DIAGNOSIS — E785 Hyperlipidemia, unspecified: Secondary | ICD-10-CM

## 2012-06-28 DIAGNOSIS — J449 Chronic obstructive pulmonary disease, unspecified: Secondary | ICD-10-CM

## 2012-06-28 DIAGNOSIS — I10 Essential (primary) hypertension: Secondary | ICD-10-CM

## 2012-06-28 DIAGNOSIS — E119 Type 2 diabetes mellitus without complications: Secondary | ICD-10-CM

## 2012-06-28 LAB — BASIC METABOLIC PANEL
CO2: 25 mEq/L (ref 19–32)
Calcium: 9.7 mg/dL (ref 8.4–10.5)
Creatinine, Ser: 1 mg/dL (ref 0.4–1.5)
Glucose, Bld: 108 mg/dL — ABNORMAL HIGH (ref 70–99)

## 2012-06-28 LAB — HEMOGLOBIN A1C: Hgb A1c MFr Bld: 6.5 % (ref 4.6–6.5)

## 2012-06-28 LAB — LIPID PANEL: Total CHOL/HDL Ratio: 3

## 2012-06-28 NOTE — Assessment & Plan Note (Signed)
Good compliance with medications, check labs. 

## 2012-06-28 NOTE — Patient Instructions (Signed)
Decrease metformin 500 mg from 1.5 tablet twice a day to -----> one tablet twice a day

## 2012-06-28 NOTE — Assessment & Plan Note (Signed)
CBGs are excellent, occasional CBG66. He is only taking metformin, no symptoms consistent with hypoglycemia. Plan: Decrease metformin from 750 mg twice a day to 500 mg twice a day. Labs

## 2012-06-28 NOTE — Assessment & Plan Note (Signed)
Seems to be doing great, continue with same medications, samples provided

## 2012-06-28 NOTE — Assessment & Plan Note (Signed)
Under excellent control, check a BMP 

## 2012-06-28 NOTE — Progress Notes (Signed)
  Subjective:    Patient ID: Kenneth Chan, male    DOB: 1930-10-11, 79 y.o.   MRN: [id number]  HPI Routine office visit Diabetes, good medication compliance, blood sugars range from 66-137, most blood sugars in the 80s. Denies symptoms consistent with low blood sugars even when the blood sugar is 66. Hypertension, good medication compliance, ambulatory BPs in the 130/80s Hypothyroidism, good medication compliance. Hyperlipidemia, on Lopid and Niaspan, no side effects that he can tell.   Past Medical History  Diagnosis Date  . COPD (chronic obstructive pulmonary disease)     on night 02, PFT's 10/17/2008 FEV1 1.31. (62%) ratio 36 -HFA 50% January 31,2011  . DM type 2 (diabetes mellitus, type 2)   . Hyperlipidemia   . Hypothyroidism   . Hypertension     DC ace 02/09/09 (cough) > resolved  . Diverticulosis of colon   . Gallstones    Past Surgical History  Procedure Laterality Date  . Inguinal hernia repair    . Cataract extraction     Social History:  Married, children x 4 (3 girls )  retired Production designer, theatre/television/film for [employer]  Tobacco-- quit ~ 2000, used to smoke ~ 1 ppd  Alcohol use-no   Review of Systems Denies chest pain, no lower extremity edema. As far as his breathing, he takes oxygen at night, essentially no cough, sputum production or shortness or breath.  no nausea, vomiting, diarrhea.     Objective:   Physical Exam General -- alert, well-developed, no apparent distress. Lungs -- normal respiratory effort, no intercostal retractions, no accessory muscle use, and decreased breath sounds but otherwise clear.     Heart-- normal rate, regular rhythm, no murmur, and no gallop.   Extremities-- no pretibial edema bilaterally Neurologic-- alert & oriented X3 and strength normal in all extremities. Psych--  not anxious appearing and not depressed appearing.       Assessment & Plan:

## 2012-07-04 ENCOUNTER — Other Ambulatory Visit: Payer: Self-pay | Admitting: Internal Medicine

## 2012-07-04 ENCOUNTER — Encounter: Payer: Self-pay | Admitting: *Deleted

## 2012-07-04 NOTE — Telephone Encounter (Signed)
Refill done.  

## 2012-07-20 ENCOUNTER — Other Ambulatory Visit (INDEPENDENT_AMBULATORY_CARE_PROVIDER_SITE_OTHER): Payer: Medicare Other

## 2012-07-20 DIAGNOSIS — I10 Essential (primary) hypertension: Secondary | ICD-10-CM

## 2012-07-23 LAB — BASIC METABOLIC PANEL
Chloride: 105 mEq/L (ref 96–112)
Creatinine, Ser: 1.2 mg/dL (ref 0.4–1.5)
Potassium: 4.1 mEq/L (ref 3.5–5.1)

## 2012-07-24 ENCOUNTER — Encounter: Payer: Self-pay | Admitting: *Deleted

## 2012-07-28 ENCOUNTER — Other Ambulatory Visit: Payer: Self-pay | Admitting: Internal Medicine

## 2012-07-30 NOTE — Telephone Encounter (Signed)
Spoke to [facility] @ pharmacy & pt still has refills left on file.  Refill request sent in error.

## 2012-08-02 ENCOUNTER — Other Ambulatory Visit: Payer: Self-pay | Admitting: Internal Medicine

## 2012-08-02 NOTE — Telephone Encounter (Signed)
Spoke with pharmacy, pt still has refills left on file, refill request sent in error.

## 2012-08-27 ENCOUNTER — Other Ambulatory Visit: Payer: Self-pay | Admitting: Internal Medicine

## 2012-08-27 NOTE — Telephone Encounter (Signed)
Per pharmacy Rx on file form May 2010, can disregard request

## 2012-12-13 ENCOUNTER — Other Ambulatory Visit: Payer: Self-pay | Admitting: Internal Medicine

## 2012-12-13 NOTE — Telephone Encounter (Signed)
rx refilled per protocol. DJR  

## 2012-12-31 ENCOUNTER — Telehealth: Payer: Self-pay

## 2012-12-31 NOTE — Telephone Encounter (Signed)
Medication List and allergies: done  Pharmacy updated, uses [facility]  for 90 day supply Pharmacy undated, uses [facility] for local prescriptions  HM UTD: yes Immunizations due: Admin flu vaccine upon arrival  A/P:  LAST: PSA: DRE 05/2011 WNL  CCS: WNL 06/2006 DM: WNL Eye exam 05/2012 HTN: WNL 06/2012 Lipids: Na  To Discuss with Provider:  Questions concerning Niacin

## 2013-01-01 ENCOUNTER — Ambulatory Visit (INDEPENDENT_AMBULATORY_CARE_PROVIDER_SITE_OTHER): Payer: Medicare Other | Admitting: Internal Medicine

## 2013-01-01 ENCOUNTER — Encounter: Payer: Self-pay | Admitting: Internal Medicine

## 2013-01-01 VITALS — BP 149/72 | HR 65 | Temp 97.8°F | Ht 68.4 in | Wt 164.8 lb

## 2013-01-01 DIAGNOSIS — E785 Hyperlipidemia, unspecified: Secondary | ICD-10-CM

## 2013-01-01 DIAGNOSIS — E039 Hypothyroidism, unspecified: Secondary | ICD-10-CM

## 2013-01-01 DIAGNOSIS — Z0000 Encounter for general adult medical examination without abnormal findings: Secondary | ICD-10-CM

## 2013-01-01 DIAGNOSIS — J449 Chronic obstructive pulmonary disease, unspecified: Secondary | ICD-10-CM

## 2013-01-01 DIAGNOSIS — E119 Type 2 diabetes mellitus without complications: Secondary | ICD-10-CM

## 2013-01-01 DIAGNOSIS — Z23 Encounter for immunization: Secondary | ICD-10-CM

## 2013-01-01 DIAGNOSIS — I10 Essential (primary) hypertension: Secondary | ICD-10-CM

## 2013-01-01 LAB — CBC WITH DIFFERENTIAL/PLATELET
Basophils Absolute: 0 10*3/uL (ref 0.0–0.1)
Eosinophils Relative: 2.2 % (ref 0.0–5.0)
Hemoglobin: 13 g/dL (ref 13.0–17.0)
Lymphocytes Relative: 13.4 % (ref 12.0–46.0)
Monocytes Relative: 7.3 % (ref 3.0–12.0)
Neutro Abs: 5.1 10*3/uL (ref 1.4–7.7)
RDW: 14.1 % (ref 11.5–14.6)
WBC: 6.7 10*3/uL (ref 4.5–10.5)

## 2013-01-01 LAB — COMPREHENSIVE METABOLIC PANEL
BUN: 18 mg/dL (ref 6–23)
CO2: 30 mEq/L (ref 19–32)
Creatinine, Ser: 1 mg/dL (ref 0.4–1.5)
GFR: 80.14 mL/min (ref >60.00)
Glucose, Bld: 96 mg/dL (ref 70–99)
Sodium: 144 mEq/L (ref 135–145)
Total Bilirubin: 0.7 mg/dL (ref 0.3–1.2)
Total Protein: 7.2 g/dL (ref 6.0–8.3)

## 2013-01-01 LAB — TSH: TSH: 0.25 u[IU]/mL — ABNORMAL LOW (ref 0.35–5.50)

## 2013-01-01 LAB — HEMOGLOBIN A1C: Hgb A1c MFr Bld: 6.7 % — ABNORMAL HIGH (ref 4.6–6.5)

## 2013-01-01 NOTE — Assessment & Plan Note (Addendum)
Pt read  negative comments about niacin for cholesterol , FLP is under excellent control, as long as he is tolerating well I don't see a reason to d/c Niaspan.

## 2013-01-01 NOTE — Assessment & Plan Note (Signed)
Sees eye MD egularly. On metformin 500 mg twice a day, CBGs around 100. Labs

## 2013-01-01 NOTE — Progress Notes (Signed)
Subjective:    Patient ID: Kenneth Chan, male    DOB: Nov 30, 1930, 79 y.o.   MRN: [id number]    HPI  Here for Medicare AWV: 1.  Risk factors based on Past M, S, F history: reviewed   2.  Physical Activities: less active than last year, wife unable to exercise lately   3.  Depression/mood: neg screening   4.  Hearing: has hearing aids  5.  ADL's: totally independent, still drives   6.  Fall Risk: no recent falls, ---counseled  7. Home Safety: does feel safe at home   8. Height, weight, &visual acuity: see VS, sees the eye doctor yearly 9. Counseling:  Yes, recommend to have a medical will   10.  Labs ordered based on risk factors: yes   11. Referral Coordination, if needed   12. Care Plan, see a/p   13. Cognitive Assessment : cognition, motor skills and memory seems appropriate for age    in addition, we discussed the following issues: Hyperlipidemia,  good medication compliance, he read something about niacin, wonders if he really needs to keep taking it. Diabetes-medication compliance, ambulatory blood sugars range from 80 to 105. Hypertension-to good medication compliance, BP today 149/72, reports that BP readings at home better than today's readings. Hypothyroidism, good medication compliance.   Past Medical History  Diagnosis Date  . COPD (chronic obstructive pulmonary disease)     on night 02, PFT's 10/17/2008 FEV1 1.31. (62%) ratio 36 -HFA 50% January 31,2011  . DM type 2 (diabetes mellitus, type 2)   . Hyperlipidemia   . Hypothyroidism   . Hypertension     DC ace 02/09/09 (cough) > resolved  . Diverticulosis of colon   . Gallstones    Past Surgical History  Procedure Laterality Date  . Inguinal hernia repair    . Cataract extraction     History   Social History  . Marital Status: Married    Spouse Name: N/A    Number of Children: 4  . Years of Education: N/A   Occupational History  . retired Production designer, theatre/television/film for a Investment banker, corporate for a [employer]   Social History Main Topics  . Smoking status: Former Games developer  . Smokeless tobacco: Former Neurosurgeon     Comment: quit 2000, used to smoke 1 ppd  . Alcohol Use: No  . Drug Use: No  . Sexual Activity: Not on file   Other Topics Concern  . Not on file   Social History Narrative    Married, children x 4 (3 girls )    Family History  Problem Relation Age of Onset  . Hypertension Mother   . Coronary artery disease Mother     MI  . Diabetes Neg Hx   . Stroke Neg Hx   . Cancer Neg Hx     colon and prostate    Review of Systems  No  CP, SOB, lower extremity edema Denies  nausea, vomiting diarrhea Denies  blood in the stools (-) cough, sputum production, (-)hemoptysis No dysuria, gross hematuria, difficulty urinating        Objective:   Physical Exam BP 149/72  Pulse 65  Temp(Src) 97.8 F (36.6 C)  Ht 5' 8.4" (1.737 m)  Wt 164 lb 12.8 oz (74.753 kg)  BMI 24.78 kg/m2  SpO2 98% General -- alert, well-developed, slightly frail gentleman.  Neck --no thyromegaly , normal carotid pulse  Lungs -- normal respiratory  effort, no intercostal retractions, no accessory muscle use, and decreased breath sounds.  Heart-- normal rate, regular rhythm, no murmur.  Abdomen-- Not distended, good bowel sounds,soft, non-tender.no bruit or mas  Extremities-- no pretibial edema bilaterally  Neurologic--  alert & oriented X3. Speech normal, gait normal appropriate for age, slt frail, strength symmetric  Psych-- Cognition and judgment appear intact. Cooperative with normal attention span and concentration. No anxious appearing , no depressed appearing.      Assessment & Plan:

## 2013-01-01 NOTE — Assessment & Plan Note (Signed)
Blood pressure slightly elevated today, reports good readings at home, recommend to continue self-monitoring. Check BMP

## 2013-01-01 NOTE — Assessment & Plan Note (Addendum)
Nocturnal, does to all his activities and yard work without getting short of breath, samples for Symbicort provided, flu shot today

## 2013-01-01 NOTE — Patient Instructions (Signed)
Get your blood work before you leave  Next visit in 6 months  for a check up  Check the  blood pressure 2 or 3 times a month be sure it is between 110/60 and 140/85. Ideal blood pressure is 120/80. If it is consistently higher or lower, let me know  Fall Prevention and Home Safety Falls cause injuries and can affect all age groups. It is possible to use preventive measures to significantly decrease the likelihood of falls. There are many simple measures which can make your home safer and prevent falls. OUTDOORS  Repair cracks and edges of walkways and driveways.  Remove high doorway thresholds.  Trim shrubbery on the main path into your home.  Have good outside lighting.  Clear walkways of tools, rocks, debris, and clutter.  Check that handrails are not broken and are securely fastened. Both sides of steps should have handrails.  Have leaves, snow, and ice cleared regularly.  Use sand or salt on walkways during winter months.  In the garage, clean up grease or oil spills. BATHROOM  Install night lights.  Install grab bars by the toilet and in the tub and shower.  Use non-skid mats or decals in the tub or shower.  Place a plastic non-slip stool in the shower to sit on, if needed.  Keep floors dry and clean up all water on the floor immediately.  Remove soap buildup in the tub or shower on a regular basis.  Secure bath mats with non-slip, double-sided rug tape.  Remove throw rugs and tripping hazards from the floors. BEDROOMS  Install night lights.  Make sure a bedside light is easy to reach.  Do not use oversized bedding.  Keep a telephone by your bedside.  Have a firm chair with side arms to use for getting dressed.  Remove throw rugs and tripping hazards from the floor. KITCHEN  Keep handles on pots and pans turned toward the center of the stove. Use back burners when possible.  Clean up spills quickly and allow time for drying.  Avoid walking on wet  floors.  Avoid hot utensils and knives.  Position shelves so they are not too high or low.  Place commonly used objects within easy reach.  If necessary, use a sturdy step stool with a grab bar when reaching.  Keep electrical cables out of the way.  Do not use floor polish or wax that makes floors slippery. If you must use wax, use non-skid floor wax.  Remove throw rugs and tripping hazards from the floor. STAIRWAYS  Never leave objects on stairs.  Place handrails on both sides of stairways and use them. Fix any loose handrails. Make sure handrails on both sides of the stairways are as long as the stairs.  Check carpeting to make sure it is firmly attached along stairs. Make repairs to worn or loose carpet promptly.  Avoid placing throw rugs at the top or bottom of stairways, or properly secure the rug with carpet tape to prevent slippage. Get rid of throw rugs, if possible.  Have an electrician put in a light switch at the top and bottom of the stairs. OTHER FALL PREVENTION TIPS  Wear low-heel or rubber-soled shoes that are supportive and fit well. Wear closed toe shoes.  When using a stepladder, make sure it is fully opened and both spreaders are firmly locked. Do not climb a closed stepladder.  Add color or contrast paint or tape to grab bars and handrails in your home. Place contrasting  color strips on first and last steps.  Learn and use mobility aids as needed. Install an electrical emergency response system.  Turn on lights to avoid dark areas. Replace light bulbs that burn out immediately. Get light switches that glow.  Arrange furniture to create clear pathways. Keep furniture in the same place.  Firmly attach carpet with non-skid or double-sided tape.  Eliminate uneven floor surfaces.  Select a carpet pattern that does not visually hide the edge of steps.  Be aware of all pets. OTHER HOME SAFETY TIPS  Set the water temperature for 120 F (48.8 C).  Keep  emergency numbers on or near the telephone.  Keep smoke detectors on every level of the home and near sleeping areas. Document Released: 02/25/2002 Document Revised: 09/06/2011 Document Reviewed: 05/27/2011 [facility] Patient Information 2014 [place], [place].

## 2013-01-01 NOTE — Assessment & Plan Note (Addendum)
Td  2009, Pneumonia shot 2006, and 2011 , had a  shingles shot 2010 Flu shot today Cscope 06-2006 @ [facility], next Cscope  "as needed". Check a CBC  Counseled about diet, exercise Prostate cancer ---> See previous entry, no further screening.

## 2013-01-01 NOTE — Assessment & Plan Note (Signed)
Due for labs

## 2013-01-03 ENCOUNTER — Telehealth: Payer: Self-pay | Admitting: *Deleted

## 2013-01-03 DIAGNOSIS — E039 Hypothyroidism, unspecified: Secondary | ICD-10-CM

## 2013-01-03 MED ORDER — LEVOTHYROXINE SODIUM 175 MCG PO TABS: 175.0000 ug | ORAL_TABLET | Freq: Every day | ORAL | Status: DC

## 2013-01-03 NOTE — Telephone Encounter (Signed)
Pt notified via tele, made aware to stop current dose of synthroid. New rx ordered and sent. Lab visit scheduled 02/11/13 for TSH. DJR

## 2013-01-03 NOTE — Telephone Encounter (Signed)
Message copied by Eustace Quail on Thu Jan 03, 2013  2:15 PM ------      Message from: Willow Ora E      Created: Thu Jan 03, 2013  8:05 AM       Call patient:      Diabetes is under excellent control      Needs to take less thyroid medication, discontinue Synthroid 200 mcg, start Synthroid 175 mcg 1 tablet daily, #30 and 3 refills.      Arrange a TSH (dx hypothyroidism) to be done  6 weeks from now.      Otherwise continue with same medications ------

## 2013-02-11 ENCOUNTER — Other Ambulatory Visit (INDEPENDENT_AMBULATORY_CARE_PROVIDER_SITE_OTHER): Payer: Medicare Other

## 2013-02-11 DIAGNOSIS — E039 Hypothyroidism, unspecified: Secondary | ICD-10-CM

## 2013-02-11 LAB — TSH: TSH: 1.1 u[IU]/mL (ref 0.35–5.50)

## 2013-05-03 ENCOUNTER — Other Ambulatory Visit: Payer: Self-pay | Admitting: Internal Medicine

## 2013-06-03 LAB — HM DIABETES EYE EXAM

## 2013-06-06 ENCOUNTER — Encounter: Payer: Self-pay | Admitting: Internal Medicine

## 2013-06-19 ENCOUNTER — Ambulatory Visit (INDEPENDENT_AMBULATORY_CARE_PROVIDER_SITE_OTHER): Payer: Medicare Other | Admitting: Family Medicine

## 2013-06-19 ENCOUNTER — Emergency Department (HOSPITAL_COMMUNITY): Payer: Medicare Other

## 2013-06-19 ENCOUNTER — Ambulatory Visit: Payer: Medicare Other

## 2013-06-19 ENCOUNTER — Encounter (HOSPITAL_COMMUNITY): Payer: Self-pay | Admitting: Emergency Medicine

## 2013-06-19 ENCOUNTER — Emergency Department (HOSPITAL_COMMUNITY)
Admission: EM | Admit: 2013-06-19 | Discharge: 2013-06-19 | Disposition: A | Discharge status: Home / Self Care | Payer: Medicare Other | Attending: Emergency Medicine | Admitting: Emergency Medicine

## 2013-06-19 VITALS — BP 104/50 | HR 101 | Temp 97.3°F | Resp 20 | Ht 67.5 in | Wt 167.0 lb

## 2013-06-19 DIAGNOSIS — R296 Repeated falls: Secondary | ICD-10-CM

## 2013-06-19 DIAGNOSIS — R269 Unspecified abnormalities of gait and mobility: Secondary | ICD-10-CM

## 2013-06-19 DIAGNOSIS — S42309A Unspecified fracture of shaft of humerus, unspecified arm, initial encounter for closed fracture: Secondary | ICD-10-CM

## 2013-06-19 DIAGNOSIS — I4891 Unspecified atrial fibrillation: Secondary | ICD-10-CM

## 2013-06-19 DIAGNOSIS — Y929 Unspecified place or not applicable: Secondary | ICD-10-CM | POA: Insufficient documentation

## 2013-06-19 DIAGNOSIS — Z79899 Other long term (current) drug therapy: Secondary | ICD-10-CM | POA: Insufficient documentation

## 2013-06-19 DIAGNOSIS — R918 Other nonspecific abnormal finding of lung field: Secondary | ICD-10-CM

## 2013-06-19 DIAGNOSIS — Z9981 Dependence on supplemental oxygen: Secondary | ICD-10-CM | POA: Insufficient documentation

## 2013-06-19 DIAGNOSIS — M25511 Pain in right shoulder: Secondary | ICD-10-CM

## 2013-06-19 DIAGNOSIS — Z792 Long term (current) use of antibiotics: Secondary | ICD-10-CM | POA: Insufficient documentation

## 2013-06-19 DIAGNOSIS — I471 Supraventricular tachycardia: Secondary | ICD-10-CM

## 2013-06-19 DIAGNOSIS — I4719 Other supraventricular tachycardia: Secondary | ICD-10-CM

## 2013-06-19 DIAGNOSIS — W010XXA Fall on same level from slipping, tripping and stumbling without subsequent striking against object, initial encounter: Secondary | ICD-10-CM | POA: Insufficient documentation

## 2013-06-19 DIAGNOSIS — W19XXXA Unspecified fall, initial encounter: Secondary | ICD-10-CM

## 2013-06-19 DIAGNOSIS — S4290XA Fracture of unspecified shoulder girdle, part unspecified, initial encounter for closed fracture: Secondary | ICD-10-CM

## 2013-06-19 DIAGNOSIS — R222 Localized swelling, mass and lump, trunk: Secondary | ICD-10-CM | POA: Insufficient documentation

## 2013-06-19 DIAGNOSIS — E039 Hypothyroidism, unspecified: Secondary | ICD-10-CM | POA: Insufficient documentation

## 2013-06-19 DIAGNOSIS — Z7982 Long term (current) use of aspirin: Secondary | ICD-10-CM | POA: Insufficient documentation

## 2013-06-19 DIAGNOSIS — S42209A Unspecified fracture of upper end of unspecified humerus, initial encounter for closed fracture: Secondary | ICD-10-CM | POA: Insufficient documentation

## 2013-06-19 DIAGNOSIS — E119 Type 2 diabetes mellitus without complications: Secondary | ICD-10-CM | POA: Insufficient documentation

## 2013-06-19 DIAGNOSIS — M25519 Pain in unspecified shoulder: Secondary | ICD-10-CM

## 2013-06-19 DIAGNOSIS — E785 Hyperlipidemia, unspecified: Secondary | ICD-10-CM | POA: Insufficient documentation

## 2013-06-19 DIAGNOSIS — Z8719 Personal history of other diseases of the digestive system: Secondary | ICD-10-CM | POA: Insufficient documentation

## 2013-06-19 DIAGNOSIS — Y9389 Activity, other specified: Secondary | ICD-10-CM | POA: Insufficient documentation

## 2013-06-19 DIAGNOSIS — J438 Other emphysema: Secondary | ICD-10-CM | POA: Insufficient documentation

## 2013-06-19 DIAGNOSIS — I10 Essential (primary) hypertension: Secondary | ICD-10-CM | POA: Insufficient documentation

## 2013-06-19 DIAGNOSIS — Z87891 Personal history of nicotine dependence: Secondary | ICD-10-CM | POA: Insufficient documentation

## 2013-06-19 DIAGNOSIS — R0989 Other specified symptoms and signs involving the circulatory and respiratory systems: Secondary | ICD-10-CM

## 2013-06-19 DIAGNOSIS — R2681 Unsteadiness on feet: Secondary | ICD-10-CM

## 2013-06-19 HISTORY — DX: Other supraventricular tachycardia: I47.19

## 2013-06-19 HISTORY — DX: Fracture of unspecified shoulder girdle, part unspecified, initial encounter for closed fracture: S42.90XA

## 2013-06-19 HISTORY — DX: Supraventricular tachycardia: I47.1

## 2013-06-19 HISTORY — DX: Other nonspecific abnormal finding of lung field: R91.8

## 2013-06-19 LAB — CBC WITH DIFFERENTIAL/PLATELET
BASOS ABS: 0 10*3/uL (ref 0.0–0.1)
Basophils Relative: 0 % (ref 0–1)
Eosinophils Absolute: 0 10*3/uL (ref 0.0–0.7)
Eosinophils Relative: 0 % (ref 0–5)
HEMATOCRIT: 34 % — AB (ref 39.0–52.0)
Hemoglobin: 11 g/dL — ABNORMAL LOW (ref 13.0–17.0)
LYMPHS PCT: 5 % — AB (ref 12–46)
Lymphs Abs: 0.6 10*3/uL — ABNORMAL LOW (ref 0.7–4.0)
MCH: 30.3 pg (ref 26.0–34.0)
MCHC: 32.4 g/dL (ref 30.0–36.0)
MCV: 93.7 fL (ref 78.0–100.0)
MONO ABS: 0.6 10*3/uL (ref 0.1–1.0)
MONOS PCT: 5 % (ref 3–12)
Neutro Abs: 10.7 10*3/uL — ABNORMAL HIGH (ref 1.7–7.7)
Neutrophils Relative %: 90 % — ABNORMAL HIGH (ref 43–77)
Platelets: 217 10*3/uL (ref 150–400)
RBC: 3.63 MIL/uL — ABNORMAL LOW (ref 4.22–5.81)
RDW: 14.1 % (ref 11.5–15.5)
WBC: 11.9 10*3/uL — AB (ref 4.0–10.5)

## 2013-06-19 LAB — COMPREHENSIVE METABOLIC PANEL
ALBUMIN: 3.4 g/dL — AB (ref 3.5–5.2)
ALT: 9 U/L (ref 0–53)
AST: 20 U/L (ref 0–37)
Alkaline Phosphatase: 70 U/L (ref 39–117)
BUN: 25 mg/dL — ABNORMAL HIGH (ref 6–23)
CHLORIDE: 105 meq/L (ref 96–112)
CO2: 23 mEq/L (ref 19–32)
CREATININE: 1.14 mg/dL (ref 0.50–1.35)
Calcium: 10.4 mg/dL (ref 8.4–10.5)
GFR calc Af Amer: 68 mL/min — ABNORMAL LOW (ref >90)
GFR calc non Af Amer: 59 mL/min — ABNORMAL LOW (ref >90)
Glucose, Bld: 139 mg/dL — ABNORMAL HIGH (ref 70–99)
POTASSIUM: 4.6 meq/L (ref 3.7–5.3)
Sodium: 144 mEq/L (ref 137–147)
Total Bilirubin: 0.5 mg/dL (ref 0.3–1.2)
Total Protein: 6.8 g/dL (ref 6.0–8.3)

## 2013-06-19 LAB — LIPASE, BLOOD: Lipase: 26 U/L (ref 11–59)

## 2013-06-19 MED ORDER — LEVOFLOXACIN 500 MG PO TABS: 500.0000 mg | ORAL_TABLET | Freq: Every day | ORAL | Status: DC

## 2013-06-19 MED ORDER — SODIUM CHLORIDE 0.9 % IV SOLN
INTRAVENOUS | Status: DC
  Administered 2013-06-19: 15:00:00 via INTRAVENOUS

## 2013-06-19 MED ORDER — IOHEXOL 300 MG/ML  SOLN
80.0000 mL | Freq: Once | INTRAMUSCULAR | Status: AC | PRN
  Administered 2013-06-19: 80 mL via INTRAVENOUS

## 2013-06-19 MED ORDER — TRAMADOL HCL 50 MG PO TABS: 50.0000 mg | ORAL_TABLET | Freq: Four times a day (QID) | ORAL | Status: DC | PRN

## 2013-06-19 NOTE — Progress Notes (Signed)
Orthopedic Tech Progress Note Patient Details:  Kenneth Chan July 10, 1930 [id number]  Ortho Devices Type of Ortho Device: Sling immobilizer Ortho Device/Splint Interventions: Application   Cammer, Theodoro Parma 06/19/2013, 1:17 PM

## 2013-06-19 NOTE — ED Notes (Signed)
Dr. Zackowski at bedside  

## 2013-06-19 NOTE — ED Notes (Signed)
Per [facility], pt fell yesterday evening 5pm.  Pt injured right shoulder.  VS 123/57, 84 (irregular, PVC and PAC).  Pt denies hx of cardiac problems.  SpO2 95% room air, CBG 174 (hx diabetes).  Picked up from [facility].  Xray shows humeral head fracture.  Xray shows large abnormality in right lung.  Pt states he was previously diagnosed 10-75yrs ago with something but does not remember specifically what.  20 g l forearm.  No pain meds given.  Arm currently supported.   Pt reports he was putting away his lawn mower and tripped coming into the shed and fell backwards, landing on his right shoulder.  Denies LOC and head trauma.  Pt takes aspirin 81mg  daily. Patient denies pain at rest.  States sharp pain w/ movement of arm.   No acute distress, respirations equal and unlabored, skin warm and dry.

## 2013-06-19 NOTE — ED Provider Notes (Signed)
CSN: [id number]     Arrival date & time 06/19/13  1031 History   First MD Initiated Contact with Patient 06/19/13 1046     Chief Complaint  Patient presents with  . Shoulder Injury     (Consider location/radiation/quality/duration/timing/severity/associated sxs/prior Treatment) The history is provided by the patient and the spouse.   80 year old male sent in from [place] urgent care. Patient had a fall yesterday evening around 5 PM injuring his right shoulder. Did not have syncope. Patient originally went to [place] urgent care had x-rays of his right shoulder showing proximal humerus fracture. In addition chest x-ray was done which showed a lung mass on the right side. Radiology recommended CT scan with contrast and patient was sent here. Patient had not been having any significant pulmonary problems. Patient does have a history of COPD emphysema. Patient is also diabetic. Patient's room air saturations have been above 90% at the urgent care.  Past Medical History  Diagnosis Date  . COPD (chronic obstructive pulmonary disease)     on night 02, PFT's 10/17/2008 FEV1 1.31. (62%) ratio 36 -HFA 50% January 31,2011  . DM type 2 (diabetes mellitus, type 2)   . Hyperlipidemia   . Hypothyroidism   . Hypertension     DC ace 02/09/09 (cough) > resolved  . Diverticulosis of colon   . Gallstones    Past Surgical History  Procedure Laterality Date  . Inguinal hernia repair    . Cataract extraction     Family History  Problem Relation Age of Onset  . Hypertension Mother   . Coronary artery disease Mother     MI  . Diabetes Neg Hx   . Stroke Neg Hx   . Cancer Neg Hx     colon and prostate   History  Substance Use Topics  . Smoking status: Former Research scientist (life sciences)  . Smokeless tobacco: Former Systems developer     Comment: quit 2000, used to smoke 1 ppd  . Alcohol Use: No    Review of Systems  Constitutional: Negative for fever.  HENT: Negative for congestion.   Eyes: Negative for visual disturbance.   Respiratory: Negative for shortness of breath.   Cardiovascular: Negative for chest pain.  Gastrointestinal: Negative for nausea, vomiting and abdominal pain.  Genitourinary: Negative for dysuria.  Musculoskeletal: Negative for back pain and neck pain.  Skin: Negative for rash.  Neurological: Negative for headaches.  Hematological: Does not bruise/bleed easily.  Psychiatric/Behavioral: Negative for confusion.      Allergies  Review of patient's allergies indicates no known allergies.  Home Medications   Current Outpatient Rx  Name  Route  Sig  Dispense  Refill  . aspirin 81 MG tablet   Oral   Take 81 mg by mouth daily.           . budesonide-formoterol (SYMBICORT) 160-4.5 MCG/ACT inhaler      2 puffs first thing in am, 2 puffs again in pm about 12 hours later.          Marland Kitchen gemfibrozil (LOPID) 600 MG tablet   Oral   Take 600 mg by mouth 2 (two) times daily before a meal.         . levothyroxine (SYNTHROID, LEVOTHROID) 175 MCG tablet   Oral   Take 175 mcg by mouth daily before breakfast.         . metFORMIN (GLUCOPHAGE) 500 MG tablet   Oral   Take 500 mg by mouth 2 (two) times daily with a meal.         .  multivitamin-iron-minerals-folic acid (CENTRUM) chewable tablet   Oral   Chew 1 tablet by mouth daily.           . niacin (NIASPAN) 1000 MG CR tablet   Oral   Take 2 mg by mouth at bedtime.           . valsartan-hydrochlorothiazide (DIOVAN-HCT) 320-25 MG per tablet   Oral   Take 1 tablet by mouth daily.         Marland Kitchen levofloxacin (LEVAQUIN) 500 MG tablet   Oral   Take 1 tablet (500 mg total) by mouth daily.   7 tablet   0   . ONE TOUCH ULTRA TEST test strip      CHECK BLOOD SUGAR AS DIRECTED.   100 each   5     Dx:250.00   . traMADol (ULTRAM) 50 MG tablet   Oral   Take 1 tablet (50 mg total) by mouth every 6 (six) hours as needed.   20 tablet   0    BP 124/60  Pulse 101  Temp(Src) 98.4 F (36.9 C) (Oral)  Resp 23  SpO2  94% Physical Exam  Nursing note and vitals reviewed. Constitutional: He is oriented to person, place, and time. He appears well-developed and well-nourished. No distress.  HENT:  Head: Normocephalic and atraumatic.  Mouth/Throat: Oropharynx is clear and moist.  Eyes: Conjunctivae and EOM are normal. Pupils are equal, round, and reactive to light.  Neck: Normal range of motion. Neck supple.  Cardiovascular: Normal rate, regular rhythm and normal heart sounds.   No murmur heard. Pulmonary/Chest: Effort normal and breath sounds normal. No respiratory distress. He has no wheezes. He has no rales.  Abdominal: Soft. Bowel sounds are normal. There is no tenderness.  Musculoskeletal: Normal range of motion. He exhibits tenderness.  Normal except for her tenderness to the proximal part of the right humerus. Questionable deformity. Radial pulse distally is 2+. Good range of motion at the wrist and the fingers.  Neurological: He is alert and oriented to person, place, and time. No cranial nerve deficit. He exhibits normal muscle tone. Coordination normal.  Skin: Skin is warm.    ED Course  Procedures (including critical care time) Labs Review Labs Reviewed  COMPREHENSIVE METABOLIC PANEL - Abnormal; Notable for the following:    Glucose, Bld 139 (*)    BUN 25 (*)    Albumin 3.4 (*)    GFR calc non Af Amer 59 (*)    GFR calc Af Amer 68 (*)    All other components within normal limits  CBC WITH DIFFERENTIAL - Abnormal; Notable for the following:    WBC 11.9 (*)    RBC 3.63 (*)    Hemoglobin 11.0 (*)    HCT 34.0 (*)    Neutrophils Relative % 90 (*)    Neutro Abs 10.7 (*)    Lymphocytes Relative 5 (*)    Lymphs Abs 0.6 (*)    All other components within normal limits  LIPASE, BLOOD   Results for orders placed during the hospital encounter of 06/19/13  COMPREHENSIVE METABOLIC PANEL      Result Value Ref Range   Sodium 144  137 - 147 mEq/L   Potassium 4.6  3.7 - 5.3 mEq/L   Chloride 105   96 - 112 mEq/L   CO2 23  19 - 32 mEq/L   Glucose, Bld 139 (*) 70 - 99 mg/dL   BUN 25 (*) 6 - 23 mg/dL   Creatinine, Ser  1.14  0.50 - 1.35 mg/dL   Calcium 10.4  8.4 - 10.5 mg/dL   Total Protein 6.8  6.0 - 8.3 g/dL   Albumin 3.4 (*) 3.5 - 5.2 g/dL   AST 20  0 - 37 U/L   ALT 9  0 - 53 U/L   Alkaline Phosphatase 70  39 - 117 U/L   Total Bilirubin 0.5  0.3 - 1.2 mg/dL   GFR calc non Af Amer 59 (*) >90 mL/min   GFR calc Af Amer 68 (*) >90 mL/min  LIPASE, BLOOD      Result Value Ref Range   Lipase 26  11 - 59 U/L  CBC WITH DIFFERENTIAL      Result Value Ref Range   WBC 11.9 (*) 4.0 - 10.5 K/uL   RBC 3.63 (*) 4.22 - 5.81 MIL/uL   Hemoglobin 11.0 (*) 13.0 - 17.0 g/dL   HCT 34.0 (*) 39.0 - 52.0 %   MCV 93.7  78.0 - 100.0 fL   MCH 30.3  26.0 - 34.0 pg   MCHC 32.4  30.0 - 36.0 g/dL   RDW 14.1  11.5 - 15.5 %   Platelets 217  150 - 400 K/uL   Neutrophils Relative % 90 (*) 43 - 77 %   Neutro Abs 10.7 (*) 1.7 - 7.7 K/uL   Lymphocytes Relative 5 (*) 12 - 46 %   Lymphs Abs 0.6 (*) 0.7 - 4.0 K/uL   Monocytes Relative 5  3 - 12 %   Monocytes Absolute 0.6  0.1 - 1.0 K/uL   Eosinophils Relative 0  0 - 5 %   Eosinophils Absolute 0.0  0.0 - 0.7 K/uL   Basophils Relative 0  0 - 1 %   Basophils Absolute 0.0  0.0 - 0.1 K/uL    Imaging Review Dg Chest 2 View  06/19/2013   CLINICAL DATA:  Lung mass on recent shoulder film.  EXAM: CHEST  2 VIEW  COMPARISON:  None.  FINDINGS: Lungs are adequately inflated and demonstrate in 9.5 cm rounded mass over the anterior inferior aspect of the right upper lobe. There is evidence of emphysematous disease. There is no effusion. Cardiomediastinal silhouette is within normal. There is evidence of patient's known proximal right humeral fracture. There is degenerative change of the spine with mild compression of an upper thoracic vertebral body.  IMPRESSION: 9.5 cm rounded mass over the right upper lobe suspicious for primary bronchogenic neoplasm. Recommend chest CT with  contrast for further evaluation.  Degenerative change of the spine with mild compression of an upper thoracic vertebral body.  Known right proximal humeral fracture.   Electronically Signed   By: Marin Olp M.D.   On: 06/19/2013 11:32   Dg Shoulder Right  06/19/2013   CLINICAL DATA:  Right shoulder pain secondary to a fall.  EXAM: RIGHT SHOULDER - 2+ VIEW  COMPARISON:  None.  FINDINGS: There is a comminuted impacted fracture of the right humeral neck. Humeral head is slightly subluxed, probably due to a joint effusion.  There is a large mass in the mid right lung consistent with carcinoma.  IMPRESSION: Comminuted impacted fracture of the right humeral neck with slight subluxation.  Large mass in the mid right lung consistent with carcinoma.   Electronically Signed   By: Rozetta Nunnery M.D.   On: 06/19/2013 10:30   Ct Chest W Contrast  06/19/2013   CLINICAL DATA:  Right shoulder fracture. The evaluate abnormality on chest x-ray  EXAM: CT  CHEST WITH CONTRAST  TECHNIQUE: Multidetector CT imaging of the chest was performed during intravenous contrast administration.  CONTRAST:  10mL OMNIPAQUE IOHEXOL 300 MG/ML  SOLN  COMPARISON:  Prior radiograph from 09/23/2008  FINDINGS: 1 cm right hilar lymph node is present (series 5 and image 52) pathologically enlarged mediastinal, hilar, or axillary lymph nodes are identified.  Aorta and great vessels are within normal limits. Scattered atherosclerotic plaque present within the aortic arch. Heart size is normal. No pericardial effusion. Diffuse 3 vessel coronary artery calcifications noted. Pulmonary arteries are within normal limits without evidence embolism or other acute abnormality.  Severe upper lobe predominant emphysematous changes are present within the lungs bilaterally. A large lobulated heterogeneous mass measuring 7.8 x 7.1 x 7.4 cm is present within the right upper lobe (series 2, image 23). The mass abuts the major fissure inferiorly. 8 mm peripheral satellite  nodule seen peripheral to the lesion (series 5, image 45). Heterogeneous parenchymal opacity within the posterior right upper lobe may reflect postobstructive consolidation (series 2, image 22). Finding is worrisome for primary bronchogenic carcinoma.  Few scattered nodular opacities measuring up to 5 mm seen within the peripheral left lower lobe (series 3, image 42). 4.3 cm cyst present within the right hepatic lobe. Additional scattered subcentimeter hypodensities within the bilateral hepatic lobes are too small the characterize by CT, but may represent additional small cysts. Visualized upper abdomen is otherwise unremarkable.  Multilevel degenerative changes noted within the visualized spine. No worrisome lytic or blastic osseous lesions. Right humeral fracture noted.  IMPRESSION: 1. 7.8 x 7.1 x 7.4 cm heterogeneous lobulated right upper lobe mass, most compatible with primary bronchogenic carcinoma. 2. 1 cm right hilar lymph node.  No other mediastinal adenopathy. 3. Subcentimeter nodular densities measuring up to 5 mm within the posterior left lower lobe are favored to be non neoplastic in nature, and may represent small focus of endobronchial infection. Attention at follow-up is recommended. 4. Severe upper lobe predominant emphysema. 5. Right hepatic cyst with additional subcentimeter hypodensities within the liver, too small the characterize by CT. 6. Proximal right humeral fracture.   Electronically Signed   By: Jeannine Boga M.D.   On: 06/19/2013 15:15     EKG Interpretation None      MDM   Final diagnoses:  Closed fracture of humerus  Lung mass    Patient sent in from urgent care following a fall last evening where he has a proximal humeral fracture. This can be treated with a sling immobilization and followup with orthopedics. But during the workup in urgent care they did a chest x-ray which showed a mass on the right side of the chest. CT scan with contrast here raises concerns  for this representing a bronchogenic carcinoma. Patient not febrile mild leukocytosis. We'll treat with Levaquin. We'll give referral to pulmonary medicine back to his primary care Dr. for further workup of this mass. Patient is nontoxic no acute distress currently. Patient much more comfortable with his shoulder immobilizer in place. Patient's oxygen saturations have been above 90% on room air. No significant electrolyte abnormalities. No significant liver function test abnormalities. No significant anemia. Patient's fall yesterday was not a syncopal episode.    Mervin Kung, MD 06/19/13 1540

## 2013-06-19 NOTE — Discharge Instructions (Signed)
Humerus Fracture, Treated with Immobilization The humerus is the large bone in your upper arm. You have a broken (fractured) humerus. These fractures are easily diagnosed with X-rays. TREATMENT  Simple fractures which will heal without disability are treated with simple immobilization. Immobilization means you will wear a cast, splint, or sling. You have a fracture which will do well with immobilization. The fracture will heal well simply by being held in a good position until it is stable enough to begin range of motion exercises. Do not take part in activities which would further injure your arm.  HOME CARE INSTRUCTIONS   Put ice on the injured area.  Put ice in a plastic bag.  Place a towel between your skin and the bag.  Leave the ice on for 15-20 minutes, 03-04 times a day.  If you have a cast:  Do not scratch the skin under the cast using sharp or pointed objects.  Check the skin around the cast every day. You may put lotion on any red or sore areas.  Keep your cast dry and clean.  If you have a splint:  Wear the splint as directed.  Keep your splint dry and clean.  You may loosen the elastic around the splint if your fingers become numb, tingle, or turn cold or blue.  If you have a sling:  Wear the sling as directed.  Do not put pressure on any part of your cast or splint until it is fully hardened.  Your cast or splint can be protected during bathing with a plastic bag. Do not lower the cast or splint into water.  Only take over-the-counter or prescription medicines for pain, discomfort, or fever as directed by your caregiver.  Do range of motion exercises as instructed by your caregiver.  Follow up as directed by your caregiver. This is very important in order to avoid permanent injury or disability and chronic pain. SEEK IMMEDIATE MEDICAL CARE IF:   Your skin or nails in the injured arm turn blue or [place].  Your arm feels cold or numb.  You develop severe  pain in the injured arm.  You are having problems with the medicines you were given. MAKE SURE YOU:   Understand these instructions.  Will watch your condition.  Will get help right away if you are not doing well or get worse. Document Released: 06/13/2000 Document Revised: 05/30/2011 Document Reviewed: 04/21/2010 [facility] : [facility] Patient Information 2014 [employer].  CT scan of the chest shows a mass on the right side of the chest. Followup with pulmonary medicine and your primary care Dr. will be important for further workup. Will treat with an antibiotic in the meantime just in case there is an infectious component to this mass. For the fracture to you humerus keep the arm in a sling followup with orthopedics referral provided. Take the antibiotic Levaquin as directed take the pain medicine tramadol as needed.

## 2013-06-19 NOTE — Progress Notes (Addendum)
Subjective:    Patient ID: Kenneth Chan, male    DOB: Dec 22, 1930, 80 y.o.   MRN: [id number]  This chart was scribed for Robyn Haber, MD by Maree Erie, ED Scribe.   Chief Complaint  Patient presents with   Shoulder Pain    Injured Rt Shoulder from a fall x yesterday    PCP: Kathlene November, MD  HPI  Kenneth Chan is a 80 y.o. male who presents to office complaining of a right shoulder injury that occurred yesterday around 4:00 PM. He states that he was walking into a building and fell, landing on his right shoulder. He is unsure what made him fall. He denies losing consciousness. He has bruising to the entire upper part of arm and swelling. He also reports decreased ROM of the arm. He was unable to move the arm at all immediately after the fall but has regained some ROM since yesterday. He is right hand dominant. He states that he landed mostly on his shoulder but may have hit his head. He denies any pain or deformity to the area.   He takes an aspirin everyday. He denies a history of Afib. He lives with one of his daughters.   Review of Systems  Consitutional: No fever, chills, fatigue, night sweats, lymphadenopathy, or weight changes. Eyes: No visual changes, eye redness, or discharge. ENT/Mouth: Ears: No otalgia, tinnitus, hearing loss, discharge. Nose: No congestion, rhinorrhea, sinus pain, or epistaxis. Throat: No sore throat, post nasal drip, or teeth pain. Cardiovascular: No CP, palpitations, diaphoresis, DOE, edema, orthopnea, PND. Respiratory: No cough, hemoptysis, SOB, or wheezing. Gastrointestinal: No anorexia, dysphagia, reflux, pain, nausea, vomiting, hematemesis, diarrhea, constipation, BRBPR, or melena. Genitourinary: No dysuria, frequency, urgency, hematuria, incontinence, nocturia, decreased urinary stream, discharge, impotence, or testicular pain/masses. Musculoskeletal: No stiffness, joint swelling, or weakness. Positive for right shoulder/arm pain, swelling,  ecchymosis, decreased ROM Skin: No rash, erythema, lesion changes, pain, warmth, jaundice, or pruritis. Neurological: No headache,  seizures, tremors, memory loss, coordination problems, or paresthesias.  Patient has had an unstable gait for some time now and has fallen a couple times in the last year Psychological: No anxiety, depression, hallucinations, SI/HI. Endocrine: No fatigue, polydipsia, polyphagia or polyuria. All other systems were reviewed and are otherwise negative.       Objective:   Physical Exam General: Well-developed, well-nourished male in no acute distress; appearance consistent with age of record HENT: normocephalic; atraumatic Eyes: pupils equal, round and reactive to light; extraocular muscles intact Neck: supple Heart: irregular rate and rhythm; no murmurs, rubs or gallops Lungs: clear to auscultation bilaterally.  Nontender chest with palpation Abdomen: soft; nondistended; nontender; no masses or hepatosplenomegaly; bowel sounds present Extremities: pulses normal; marked ecchymosis over the right deltoid and biceps regions with inability to elevate arm Neurologic: Awake, alert and oriented; motor function intact in all extremities and symmetric; no facial droop Skin: Warm and dry Psychiatric: Normal mood and affect  [facility] reading (PRIMARY) by Dr. Joseph Art: Right shoulder shows fracture of the humeral head which is minimally displaced and the suggestion that there is some subluxation of the head of the humerus from the glenoid. In addition there is a large right midlung mass, well-rounded. EKG: irRegular rhythm with suggestion of atrial fibrillation versus a focal atrial contractions    Assessment & Plan:  EKG is very concerning for new-onset atrial fibrillation. The unstable gait along with a fractured arm suggest the patient will have a hard time carrying out his ADLs at home for  the near future.  Finally I am very concerned about his unstable gait in general  which needs to be evaluated along with the fractured arm.  The lung mass in the right chest is otherwise a very concerning. Given the patient's history of COPD and remote history of smoking, a jar this is a neoplasm such as a bronchogenic carcinoma  We will stabilize the right shoulder fracture and transported the patient to: Hospital  I personally performed the services described in this documentation, which was scribed in my presence. The recorded information has been reviewed and is accurate.

## 2013-06-20 ENCOUNTER — Encounter: Payer: Self-pay | Admitting: Internal Medicine

## 2013-06-20 ENCOUNTER — Ambulatory Visit (INDEPENDENT_AMBULATORY_CARE_PROVIDER_SITE_OTHER): Payer: Medicare Other | Admitting: Internal Medicine

## 2013-06-20 VITALS — BP 92/51 | HR 100 | Temp 97.9°F | Wt 165.0 lb

## 2013-06-20 DIAGNOSIS — S42209A Unspecified fracture of upper end of unspecified humerus, initial encounter for closed fracture: Secondary | ICD-10-CM

## 2013-06-20 DIAGNOSIS — E119 Type 2 diabetes mellitus without complications: Secondary | ICD-10-CM

## 2013-06-20 DIAGNOSIS — D649 Anemia, unspecified: Secondary | ICD-10-CM

## 2013-06-20 DIAGNOSIS — R918 Other nonspecific abnormal finding of lung field: Secondary | ICD-10-CM

## 2013-06-20 DIAGNOSIS — I10 Essential (primary) hypertension: Secondary | ICD-10-CM

## 2013-06-20 DIAGNOSIS — E039 Hypothyroidism, unspecified: Secondary | ICD-10-CM

## 2013-06-20 DIAGNOSIS — R222 Localized swelling, mass and lump, trunk: Secondary | ICD-10-CM

## 2013-06-20 LAB — FERRITIN: Ferritin: 79.8 ng/mL (ref 22.0–322.0)

## 2013-06-20 LAB — HEMOGLOBIN A1C: Hgb A1c MFr Bld: 6.5 % (ref 4.6–6.5)

## 2013-06-20 LAB — HEMOGLOBIN: Hemoglobin: 10.5 g/dL — ABNORMAL LOW (ref 13.0–17.0)

## 2013-06-20 LAB — IRON: Iron: 16 ug/dL — ABNORMAL LOW (ref 42–165)

## 2013-06-20 LAB — TSH: TSH: 1.16 u[IU]/mL (ref 0.35–5.50)

## 2013-06-20 NOTE — Progress Notes (Signed)
Subjective:    Patient ID: Kenneth Chan, male    DOB: [birth year], 80 y.o.   MRN: [id number]  DOS:  06/20/2013 Type of  visit: Acute visit   Was seen at the urgent care and subsequently at the ER with several issues: Had a mechanical fall, no syncope , Had a right shoulder fracture, currently in a sling. Incidentally, they found a lung mass, patient is aware He was found to have anemia. Question of atrial fibrillation, EKG poor quality, looks like  sinus rhythm.   ROS Denies any frequent falls. No fever, chills. No cough, chest pain or difficulty breathing. Denies any nausea, vomiting, blood in the stools, change in the color of his stools or abdominal pain.   Past Medical History  Diagnosis Date  . COPD (chronic obstructive pulmonary disease)     on night 02, PFT's 10/17/2008 FEV1 1.31. (62%) ratio 36 -HFA 50% January 31,2011  . DM type 2 (diabetes mellitus, type 2)   . Hyperlipidemia   . Hypothyroidism   . Hypertension     DC ace 02/09/09 (cough) > resolved  . Diverticulosis of colon   . Gallstones     Past Surgical History  Procedure Laterality Date  . Inguinal hernia repair    . Cataract extraction      History   Social History  . Marital Status: Married    Spouse Name: N/A    Number of Children: 4  . Years of Education: N/A   Occupational History  . retired Freight forwarder for a Designer, jewellery for a [employer]   Social History Main Topics  . Smoking status: Former Research scientist (life sciences)  . Smokeless tobacco: Former Systems developer     Comment: quit 2000, used to smoke 1 ppd  . Alcohol Use: No  . Drug Use: No  . Sexual Activity: Not on file   Other Topics Concern  . Not on file   Social History Narrative    Married, children x 4 (3 girls )         Medication List       This list is accurate as of: 06/20/13 11:59 PM.  Always use your most recent med list.               aspirin 81 MG tablet  Take 81 mg by mouth daily.     budesonide-formoterol 160-4.5 MCG/ACT inhaler  Commonly known as:  SYMBICORT  2 puffs first thing in am, 2 puffs again in pm about 12 hours later.     gemfibrozil 600 MG tablet  Commonly known as:  LOPID  Take 600 mg by mouth 2 (two) times daily before a meal.     levothyroxine 175 MCG tablet  Commonly known as:  SYNTHROID, LEVOTHROID  Take 175 mcg by mouth daily before breakfast.     metFORMIN 500 MG tablet  Commonly known as:  GLUCOPHAGE  Take 500 mg by mouth 2 (two) times daily with a meal.     multivitamin-iron-minerals-folic acid chewable tablet  Chew 1 tablet by mouth daily.     niacin 1000 MG CR tablet  Commonly known as:  NIASPAN  Take 2 mg by mouth at bedtime.     ONE TOUCH ULTRA TEST test strip  Generic drug:  glucose blood  CHECK BLOOD SUGAR AS DIRECTED.     traMADol 50 MG tablet  Commonly known as:  ULTRAM  Take 1 tablet (50 mg total) by  mouth every 6 (six) hours as needed.     valsartan-hydrochlorothiazide 320-25 MG per tablet  Commonly known as:  DIOVAN-HCT  Take 1 tablet by mouth daily.           Objective:   Physical Exam BP 92/51  Pulse 100  Temp(Src) 97.9 F (36.6 C)  Wt 165 lb (74.844 kg)  SpO2 93% General -- alert, well-developed, NAD.   Lungs -- normal respiratory effort, no intercostal retractions, no accessory muscle use, and normal breath sounds.  Heart-- normal rate, regular rhythm, no murmur.   Extremities-- no pretibial edema bilaterally ; Right arm in a sling Neurologic--  alert & oriented X3.  Psych-- Cognition and judgment appear intact. Cooperative with normal attention span and concentration. No anxious or depressed appearing.       Assessment & Plan:   Fracture, right shoulder. Refer to orthopedic Denies any frequent falls, at some point will need to may be physical therapy  Lung mass, Patient aware, likely a malignancy. Will refer to pulmonary. Was prescribed Levaquin but denies fever, chills or cough. Recommend to  discontinue antibiotics  Anemia, GI review of systems negative. Will check a hemoglobin and iron panel.  Chronic medical problems, check a TSH in the A1c.  Question of A. Fib,  EKG today MAT, no specific ST depression (strain?),  . Discuss w/ cards, rec Verapamil ( subtle ST changes were there before ). BP today is low, will reasses on RTC in 2 weeks

## 2013-06-20 NOTE — Patient Instructions (Signed)
Please come back in 2 weeks for a check up See the orthopedic doctor

## 2013-06-20 NOTE — Progress Notes (Signed)
Pre visit review using our clinic review tool, if applicable. No additional management support is needed unless otherwise documented below in the visit note. 

## 2013-06-24 ENCOUNTER — Encounter: Payer: Self-pay | Admitting: *Deleted

## 2013-06-24 MED ORDER — FERROUS SULFATE 325 (65 FE) MG PO TABS: 325.0000 mg | ORAL_TABLET | Freq: Two times a day (BID) | ORAL | Status: DC

## 2013-06-24 NOTE — Addendum Note (Signed)
Addended by: Peggyann Shoals on: 06/24/2013 03:10 PM   Modules accepted: Orders

## 2013-07-02 ENCOUNTER — Ambulatory Visit: Payer: Medicare Other | Admitting: Internal Medicine

## 2013-07-03 ENCOUNTER — Ambulatory Visit (INDEPENDENT_AMBULATORY_CARE_PROVIDER_SITE_OTHER): Payer: Medicare Other | Admitting: Internal Medicine

## 2013-07-03 ENCOUNTER — Encounter: Payer: Self-pay | Admitting: Internal Medicine

## 2013-07-03 VITALS — BP 120/56 | HR 101 | Temp 97.5°F | Ht 69.0 in | Wt 167.0 lb

## 2013-07-03 DIAGNOSIS — R918 Other nonspecific abnormal finding of lung field: Secondary | ICD-10-CM | POA: Insufficient documentation

## 2013-07-03 DIAGNOSIS — R222 Localized swelling, mass and lump, trunk: Secondary | ICD-10-CM

## 2013-07-03 DIAGNOSIS — J449 Chronic obstructive pulmonary disease, unspecified: Secondary | ICD-10-CM

## 2013-07-03 DIAGNOSIS — J4489 Other specified chronic obstructive pulmonary disease: Secondary | ICD-10-CM

## 2013-07-03 NOTE — Patient Instructions (Signed)
Should you cough up any blood stop aspirin  Please see patient coordinator before you leave today  to schedule PET when it's convenient for you

## 2013-07-03 NOTE — Progress Notes (Signed)
Subjective:    Patient ID: Kenneth Chan, male    DOB: 23-Dec-1930    MRN: [id number]  HPI  25 yowm quit smoking 1980 documented GOLD II copd 2010  on maint symbicort and 02 just at bedtime at baseline referred 07/03/13 to pulmonary clinic for eval of R lung mass by EDP p fell and shoulder xrays showeed lung mass   07/03/2013 1st [facility] office visit in [place]  Chief Complaint  Patient presents with  . Pulmonary Consult    Pt seen here in the past for Emphysema. He was referred back per [facility] ED p shoulder fx on 06/19/13 and had ct chest significant for lung mass. He denies any respiratory co's today.   baseline  = able to walk 2 miles at [place] s stopping but becoming much more frail in last year, unsteady on feet with freq falls. No sob at present but very sedentary, no pain with deep breathing or new cough or h/o hemoptysis.   No obvious other patterns in day to day or daytime variabilty or assoc   chest tightness, subjective wheeze overt sinus or hb symptoms. No unusual exp hx or h/o childhood pna/ asthma or knowledge of premature birth.  Sleeping ok without nocturnal  or early am exacerbation  of respiratory  c/o's or need for noct saba. Also denies any obvious fluctuation of symptoms with weather or environmental changes or other aggravating or alleviating factors except as outlined above   Current Medications, Allergies, Complete Past Medical History, Past Surgical History, Family History, and Social History were reviewed in [employer] record.            From centricity record last seen 2010:  Past Medical History:  COPD--on night O2  - PFT's 10/17/2008 FEV1 1.31.( 62%) ratio 36  Diabetes mellitus, type II  Hyperlipidemia  hypothyroidism  Hypertension  - DC ACE February 09, 2009 (cough)  Diverticulosis, colon  gallstones  DIVERTICULOSIS, COLON    Review of Systems  Constitutional: Negative for fever, chills, activity change,  appetite change and unexpected weight change.  HENT: Negative for congestion, dental problem, postnasal drip, rhinorrhea, sneezing, sore throat, trouble swallowing and voice change.   Eyes: Negative for visual disturbance.  Respiratory: Negative for cough, choking and shortness of breath.   Cardiovascular: Negative for chest pain and leg swelling.  Gastrointestinal: Negative for nausea, vomiting and abdominal pain.  Genitourinary: Negative for difficulty urinating.  Musculoskeletal: Negative for arthralgias.  Skin: Negative for rash.  Psychiatric/Behavioral: Negative for behavioral problems and confusion.       Objective:   Physical Exam  amb hoarse wm nad with R shoulder sling in place looks 86 y older than stated age/ chronically ill appearing  Wt Readings from Last 3 Encounters:  07/03/13 167 lb (75.751 kg)  06/20/13 165 lb (74.844 kg)  06/19/13 167 lb (75.751 kg)      HEENT mild turbinate edema.  Oropharynx no thrush or excess pnd or cobblestoning.  No JVD or cervical adenopathy. Mild accessory muscle hypertrophy. Trachea midline, nl thryroid. Chest was hyperinflated by percussion with diminished breath sounds and moderate increased exp time without wheeze. Hoover sign positive at mid inspiration. Regular rate and rhythm without murmur gallop or rub or increase P2 or edema.  Abd: no hsm, nl excursion. Ext warm without cyanosis or clubbing.      06/19/13 9.5 cm rounded mass over the right upper lobe suspicious for primary  bronchogenic neoplasm. Recommend chest CT  with contrast for further  evaluation.  Degenerative change of the spine with mild compression of an upper  thoracic vertebral body.  Known right proximal humeral fracture.         Assessment & Plan:

## 2013-07-04 NOTE — Assessment & Plan Note (Signed)
Needs PET next and tissue dx if possible but acute problem is the fx R humerus and ? Ability to move R arm in position needed for accurate PET?   Discussed in detail all the  indications, usual  risks and alternatives  relative to the benefits with patient and fm who agree to proceed with conservative w/u for now with PET when he can tolerate then maybe fob later if no other more accessible tissue detected

## 2013-07-04 NOTE — Assessment & Plan Note (Addendum)
-   PFT's 10/17/2008 FEV1 1.31.( 62%) ratio 36   Relatively well compensated but very debilitated at this point and not sure he can tolerate w/u for lung mass much less treatment - clearly not candidate for RULobectomy and the tumor is not causing any symptoms at present.   For now therefore no change in rx

## 2013-07-05 ENCOUNTER — Ambulatory Visit (INDEPENDENT_AMBULATORY_CARE_PROVIDER_SITE_OTHER): Payer: Medicare Other | Admitting: Internal Medicine

## 2013-07-05 ENCOUNTER — Other Ambulatory Visit: Payer: Self-pay | Admitting: Internal Medicine

## 2013-07-05 ENCOUNTER — Encounter: Payer: Self-pay | Admitting: Internal Medicine

## 2013-07-05 VITALS — BP 130/65 | HR 100 | Temp 97.4°F | Wt 164.0 lb

## 2013-07-05 DIAGNOSIS — I498 Other specified cardiac arrhythmias: Secondary | ICD-10-CM

## 2013-07-05 DIAGNOSIS — D649 Anemia, unspecified: Secondary | ICD-10-CM

## 2013-07-05 DIAGNOSIS — I471 Supraventricular tachycardia: Secondary | ICD-10-CM

## 2013-07-05 DIAGNOSIS — R222 Localized swelling, mass and lump, trunk: Secondary | ICD-10-CM

## 2013-07-05 DIAGNOSIS — R918 Other nonspecific abnormal finding of lung field: Secondary | ICD-10-CM

## 2013-07-05 MED ORDER — VERAPAMIL HCL 40 MG PO TABS: 40.0000 mg | ORAL_TABLET | Freq: Three times a day (TID) | ORAL | Status: DC

## 2013-07-05 NOTE — Progress Notes (Signed)
Pre visit review using our clinic review tool, if applicable. No additional management support is needed unless otherwise documented below in the visit note. 

## 2013-07-05 NOTE — Progress Notes (Signed)
Subjective:    Patient ID: Kenneth Chan, male    DOB: 05/26/1930, 80 y.o.   MRN: [id number]  DOS:  07/05/2013 Type of  visit: followup from previous visit, here with his daughter. Since the last time he was here, he saw pulmonary, note reviewed   ROS Emotionally doing well. No chest pain, shortness of breath at baseline, no lower extremity edema.   Past Medical History  Diagnosis Date  . COPD (chronic obstructive pulmonary disease)     on night 02, PFT's 10/17/2008 FEV1 1.31. (62%) ratio 36 -HFA 50% January 31,2011  . DM type 2 (diabetes mellitus, type 2)   . Hyperlipidemia   . Hypothyroidism   . Hypertension     DC ace 02/09/09 (cough) > resolved  . Diverticulosis of colon   . Gallstones     Past Surgical History  Procedure Laterality Date  . Inguinal hernia repair    . Cataract extraction      History   Social History  . Marital Status: Married    Spouse Name: N/A    Number of Children: 4  . Years of Education: N/A   Occupational History  . retired Freight forwarder for a Designer, jewellery for a [employer]   Social History Main Topics  . Smoking status: Former Smoker -- 1.00 packs/day for 25 years    Types: Cigarettes    Quit date: 03/21/1978  . Smokeless tobacco: Former Systems developer  . Alcohol Use: No  . Drug Use: No  . Sexual Activity: Not on file   Other Topics Concern  . Not on file   Social History Narrative    Married, children x 4 (3 girls )         Medication List       This list is accurate as of: 07/05/13 11:59 PM.  Always use your most recent med list.               aspirin 81 MG tablet  Take 81 mg by mouth daily.     budesonide-formoterol 160-4.5 MCG/ACT inhaler  Commonly known as:  SYMBICORT  2 puffs first thing in am, 2 puffs again in pm about 12 hours later.     ferrous sulfate 325 (65 FE) MG tablet  Commonly known as:  FERROUSUL  Take 1 tablet (325 mg total) by mouth 2 (two) times daily with a meal.     gemfibrozil 600 MG tablet  Commonly known as:  LOPID  TAKE 1 TABLET TWICE A DAY     levothyroxine 175 MCG tablet  Commonly known as:  SYNTHROID, LEVOTHROID  Take 175 mcg by mouth daily before breakfast.     metFORMIN 500 MG tablet  Commonly known as:  GLUCOPHAGE  Take 500 mg by mouth 2 (two) times daily with a meal.     multivitamin-iron-minerals-folic acid chewable tablet  Chew 1 tablet by mouth daily.     ONE TOUCH ULTRA TEST test strip  Generic drug:  glucose blood  CHECK BLOOD SUGAR AS DIRECTED.     traMADol 50 MG tablet  Commonly known as:  ULTRAM  Take 1 tablet (50 mg total) by mouth every 6 (six) hours as needed.     valsartan-hydrochlorothiazide 320-25 MG per tablet  Commonly known as:  DIOVAN-HCT  Take 0.5 tablets by mouth daily.     verapamil 40 MG tablet  Commonly known as:  CALAN  Take 1 tablet (40 mg  total) by mouth 3 (three) times daily.           Objective:   Physical Exam BP 130/65  Pulse 100  Temp(Src) 97.4 F (36.3 C)  Wt 164 lb (74.39 kg)  SpO2 92%  General -- alert, well-developed   Lungs -- normal respiratory effort, no intercostal retractions, no accessory muscle use, decreased breath sounds.  Heart-- regular , HR ~100.   Extremities-- no pretibial edema bilaterally   Psych-- Cognition and judgment appear intact. Cooperative with normal attention span and concentration. No anxious or depressed appearing.       Assessment & Plan:    Fracture, right shoulder.  Saw orthopedic surgery, on pain medications, pain well-controlled.  Lung mass,  Pulmonary, note reviewed, rx a PET scan otherwise they will try a conservative approach. He is doing well emotionally, he was provided emotional support as well.  Anemia,  Mild iron deficiency, conservative approach, on by mouth medicines. Patient and his daughter in agreement  MAT , HR ~ 100. Start low dose of verapamil, 3 times a day,monitor pulse and BPs. See instructions.  Followup in 6  weeks  High cholesterol, discontinue niacin to simplify his medical regimen  Today , I spent more than 25 min with the patient, >50% of the time counseling, and   reviewing the chart

## 2013-07-05 NOTE — Patient Instructions (Signed)
Stop niacin  Start verapamil 1 tablet 3 times a day  Check the  blood pressure 2  times a day be sure it is between 110/60 and 140/85. Ideal blood pressure is 120/80. If it is consistently higher or lower, let me know  Your pulse should not be less than 60  Come back in 6 weeks

## 2013-07-07 DIAGNOSIS — D649 Anemia, unspecified: Secondary | ICD-10-CM | POA: Insufficient documentation

## 2013-07-07 DIAGNOSIS — I471 Supraventricular tachycardia: Secondary | ICD-10-CM | POA: Insufficient documentation

## 2013-07-08 ENCOUNTER — Encounter: Payer: Self-pay | Admitting: Internal Medicine

## 2013-07-08 ENCOUNTER — Ambulatory Visit (HOSPITAL_COMMUNITY)
Admission: RE | Admit: 2013-07-08 | Discharge: 2013-07-08 | Disposition: A | Discharge status: Home / Self Care | Payer: Medicare Other | Source: Ambulatory Visit | Attending: Internal Medicine | Admitting: Internal Medicine

## 2013-07-08 DIAGNOSIS — K5730 Diverticulosis of large intestine without perforation or abscess without bleeding: Secondary | ICD-10-CM | POA: Insufficient documentation

## 2013-07-08 DIAGNOSIS — K7689 Other specified diseases of liver: Secondary | ICD-10-CM | POA: Insufficient documentation

## 2013-07-08 DIAGNOSIS — R222 Localized swelling, mass and lump, trunk: Secondary | ICD-10-CM | POA: Insufficient documentation

## 2013-07-08 DIAGNOSIS — D3500 Benign neoplasm of unspecified adrenal gland: Secondary | ICD-10-CM | POA: Insufficient documentation

## 2013-07-08 DIAGNOSIS — R918 Other nonspecific abnormal finding of lung field: Secondary | ICD-10-CM

## 2013-07-08 DIAGNOSIS — J438 Other emphysema: Secondary | ICD-10-CM | POA: Insufficient documentation

## 2013-07-08 DIAGNOSIS — K8020 Calculus of gallbladder without cholecystitis without obstruction: Secondary | ICD-10-CM | POA: Insufficient documentation

## 2013-07-08 LAB — GLUCOSE, CAPILLARY: GLUCOSE-CAPILLARY: 100 mg/dL — AB (ref 70–99)

## 2013-07-08 MED ORDER — FLUDEOXYGLUCOSE F - 18 (FDG) INJECTION
8.4000 | Freq: Once | INTRAVENOUS | Status: AC | PRN
  Administered 2013-07-08: 8.4 via INTRAVENOUS

## 2013-07-12 ENCOUNTER — Telehealth: Payer: Self-pay

## 2013-07-12 NOTE — Telephone Encounter (Signed)
Relevant patient education mailed to patient.  

## 2013-07-24 ENCOUNTER — Other Ambulatory Visit: Payer: Self-pay | Admitting: Internal Medicine

## 2013-08-05 ENCOUNTER — Encounter: Payer: Self-pay | Admitting: Internal Medicine

## 2013-08-05 ENCOUNTER — Ambulatory Visit (INDEPENDENT_AMBULATORY_CARE_PROVIDER_SITE_OTHER): Payer: Medicare Other | Admitting: Internal Medicine

## 2013-08-05 VITALS — BP 130/60 | HR 82 | Temp 97.9°F | Ht 66.5 in | Wt 160.8 lb

## 2013-08-05 DIAGNOSIS — R222 Localized swelling, mass and lump, trunk: Secondary | ICD-10-CM

## 2013-08-05 DIAGNOSIS — J449 Chronic obstructive pulmonary disease, unspecified: Secondary | ICD-10-CM

## 2013-08-05 DIAGNOSIS — R918 Other nonspecific abnormal finding of lung field: Secondary | ICD-10-CM

## 2013-08-05 NOTE — Patient Instructions (Signed)
Call me for pain with deep breath on right or coughing up blood or worse breathing for bronchoscopy at [facility] with nothing to eat after midnight the night before

## 2013-08-05 NOTE — Assessment & Plan Note (Signed)
-   PFT's 10/17/2008 FEV1 1.31.( 62%) ratio 36   Adequate control on present rx, reviewed > no change in rx needed  > symbicort 160 2bid

## 2013-08-05 NOTE — Progress Notes (Signed)
Subjective:    Patient ID: Kenneth Chan, male    DOB: 03/29/1930    MRN: [id number]    Brief patient profile:  82 yowm quit smoking 1980 documented GOLD II copd 2010  on maint symbicort and 02 just at bedtime at baseline referred 07/03/13 to pulmonary clinic for eval of R lung mass by EDP p fell and shoulder xrays showeed lung mass    History of Present Illness  07/03/2013 1st [facility] office visit in [place]  Chief Complaint  Patient presents with  . Pulmonary Consult    Pt seen here in the past for Emphysema. He was referred back per [facility] ED p shoulder fx on 06/19/13 and had ct chest significant for lung mass. He denies any respiratory co's today.   baseline  = able to walk 2 miles at [place] s stopping but becoming much more frail in last year, unsteady on feet with freq falls. No sob at present but very sedentary, no pain with deep breathing or new cough or h/o hemoptysis.  rec Should you cough up any blood stop aspirin    08/05/2013 f/u ov/Clodagh Odenthal re: copd/ lung mass Chief Complaint  Patient presents with  . Follow-up    Pt states that his breathing is doing well. He denies any new co's today.   no cp, hemoptysis, no need for saba Not limited by breathing from desired activities  But very sedentary  No obvious day to day or daytime variabilty or assoc chronic cough or cp or chest tightness, subjective wheeze overt sinus or hb symptoms. No unusual exp hx or h/o childhood pna/ asthma or knowledge of premature birth.  Sleeping ok without nocturnal  or early am exacerbation  of respiratory  c/o's or need for noct saba. Also denies any obvious fluctuation of symptoms with weather or environmental changes or other aggravating or alleviating factors except as outlined above   Current Medications, Allergies, Complete Past Medical History, Past Surgical History, Family History, and Social History were reviewed in [employer] record.  ROS  The following  are not active complaints unless bolded sore throat, dysphagia, dental problems, itching, sneezing,  nasal congestion or excess/ purulent secretions, ear ache,   fever, chills, sweats, unintended wt loss, pleuritic or exertional cp, hemoptysis,  orthopnea pnd or leg swelling, presyncope, palpitations, heartburn, abdominal pain, anorexia, nausea, vomiting, diarrhea  or change in bowel or urinary habits, change in stools or urine, dysuria,hematuria,  rash, arthralgias, visual complaints, headache, numbness weakness or ataxia or problems with walking or coordination,  change in mood/affect or memory.                   Past Medical History:  COPD--on night O2  - PFT's 10/17/2008 FEV1 1.31.( 62%) ratio 36  Diabetes mellitus, type II  Hyperlipidemia  hypothyroidism  Hypertension  - DC ACE February 09, 2009 (cough)  Diverticulosis, colon  gallstones  DIVERTICULOSIS, COLON          Objective:   Physical Exam  amb male nad   08/05/2013       161  Wt Readings from Last 3 Encounters:  07/03/13 167 lb (75.751 kg)  06/20/13 165 lb (74.844 kg)  06/19/13 167 lb (75.751 kg)      HEENT mild turbinate edema.  Oropharynx no thrush or excess pnd or cobblestoning.  No JVD or cervical adenopathy. Mild accessory muscle hypertrophy. Trachea midline, nl thryroid. Chest was hyperinflated by percussion with diminished breath  sounds and moderate increased exp time without wheeze. Hoover sign positive at mid inspiration. Regular rate and rhythm without murmur gallop or rub or increase P2 or edema.  Abd: no hsm, nl excursion. Ext warm without cyanosis or clubbing.      06/19/13 9.5 cm rounded mass over the right upper lobe suspicious for primary  bronchogenic neoplasm. Recommend chest CT with contrast for further  evaluation.  Degenerative change of the spine with mild compression of an upper  thoracic vertebral body.  Known right proximal humeral fracture.         Assessment & Plan:

## 2013-08-05 NOTE — Assessment & Plan Note (Signed)
-   See CT chest 4/1/5 - PET  07/08/13 >  C/w T1N1M0 lung ca  I had an extended discussion with the patient and daughter Kenneth Chan  today lasting 15 to 20 minutes of a 25 minute visit on the following issues:   This is not resectable for cure but could be palliated with RT and or chemo after tissue dx first which is most amenable to FOB  Discussed in detail all the  indications, usual  risks and alternatives  relative to the benefits with patient who agrees to proceed with bronchoscopy with biopsy but wants to wait until he gets over his shoulder surgery and reluctant to undergo treatment of any kind until more symptomatic so no rush for tissue dx anyway,  And fm supports his decision.

## 2013-08-16 ENCOUNTER — Encounter: Payer: Self-pay | Admitting: Internal Medicine

## 2013-08-16 ENCOUNTER — Ambulatory Visit (INDEPENDENT_AMBULATORY_CARE_PROVIDER_SITE_OTHER): Payer: Medicare Other | Admitting: Internal Medicine

## 2013-08-16 VITALS — BP 132/67 | HR 79 | Temp 97.8°F | Wt 159.0 lb

## 2013-08-16 DIAGNOSIS — R222 Localized swelling, mass and lump, trunk: Secondary | ICD-10-CM

## 2013-08-16 DIAGNOSIS — D649 Anemia, unspecified: Secondary | ICD-10-CM

## 2013-08-16 DIAGNOSIS — I498 Other specified cardiac arrhythmias: Secondary | ICD-10-CM

## 2013-08-16 DIAGNOSIS — J441 Chronic obstructive pulmonary disease with (acute) exacerbation: Secondary | ICD-10-CM

## 2013-08-16 DIAGNOSIS — R918 Other nonspecific abnormal finding of lung field: Secondary | ICD-10-CM

## 2013-08-16 DIAGNOSIS — I10 Essential (primary) hypertension: Secondary | ICD-10-CM

## 2013-08-16 DIAGNOSIS — I471 Supraventricular tachycardia: Secondary | ICD-10-CM

## 2013-08-16 LAB — CBC WITH DIFFERENTIAL/PLATELET
BASOS PCT: 0.3 % (ref 0.0–3.0)
Basophils Absolute: 0 10*3/uL (ref 0.0–0.1)
Eosinophils Absolute: 0.1 10*3/uL (ref 0.0–0.7)
Eosinophils Relative: 1.1 % (ref 0.0–5.0)
HCT: 34.6 % — ABNORMAL LOW (ref 39.0–52.0)
Hemoglobin: 11 g/dL — ABNORMAL LOW (ref 13.0–17.0)
LYMPHS PCT: 11 % — AB (ref 12.0–46.0)
Lymphs Abs: 1.1 10*3/uL (ref 0.7–4.0)
MCHC: 31.9 g/dL (ref 30.0–36.0)
MCV: 92.6 fl (ref 78.0–100.0)
MONOS PCT: 6.1 % (ref 3.0–12.0)
Monocytes Absolute: 0.6 10*3/uL (ref 0.1–1.0)
Neutro Abs: 7.9 10*3/uL — ABNORMAL HIGH (ref 1.4–7.7)
Neutrophils Relative %: 81.5 % — ABNORMAL HIGH (ref 43.0–77.0)
Platelets: 366 10*3/uL (ref 150.0–400.0)
RBC: 3.73 Mil/uL — AB (ref 4.22–5.81)
RDW: 16 % — ABNORMAL HIGH (ref 11.5–15.5)
WBC: 9.7 10*3/uL (ref 4.0–10.5)

## 2013-08-16 LAB — IRON: IRON: 35 ug/dL — AB (ref 42–165)

## 2013-08-16 LAB — FERRITIN: Ferritin: 90.3 ng/mL (ref 22.0–322.0)

## 2013-08-16 MED ORDER — BUDESONIDE-FORMOTEROL FUMARATE 160-4.5 MCG/ACT IN AERO: 2.0000 | INHALATION_SPRAY | Freq: Two times a day (BID) | RESPIRATORY_TRACT | Status: AC

## 2013-08-16 NOTE — Patient Instructions (Signed)
Get your blood work before you leave   Next visit is for routine check up in 4 months

## 2013-08-16 NOTE — Assessment & Plan Note (Signed)
Well controlled 

## 2013-08-16 NOTE — Progress Notes (Signed)
Subjective:    Patient ID: Kenneth Chan, male    DOB: [birth year], 80 y.o.   MRN: [id number]  DOS:  08/16/2013 Type of  Visit: followup from previous visit Lung mass, recently saw pulmonary, bronchoscopy is planned. Anemia, good compliance with iron and vitamins MAT, asymptomatic, on verapamil, ambulatory pulse in the 60s, 70s. Hypertension, good medication compliance, ambulatory BP 120, 130/60. Fracture or shoulder, doing better, has 1 additional visit with orthopedic surgery.   ROS Denies chest pain, difficulty breathing or extremity edema. No cough, sputum production or hemoptysis.  Past Medical History  Diagnosis Date  . COPD (chronic obstructive pulmonary disease)     on night 02, PFT's 10/17/2008 FEV1 1.31. (62%) ratio 36 -HFA 50% January 31,2011  . DM type 2 (diabetes mellitus, type 2)   . Hyperlipidemia   . Hypothyroidism   . Hypertension     DC ace 02/09/09 (cough) > resolved  . Diverticulosis of colon   . Gallstones   . Lung mass 06-2013  . 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 Shoulder fracture 06-2013    Past Surgical History  Procedure Laterality Date  . Inguinal hernia repair    . Cataract extraction      History   Social History  . Marital Status: Married    Spouse Name: N/A    Number of Children: 4  . Years of Education: N/A   Occupational History  . retired Freight forwarder for a Designer, jewellery for a [employer]   Social History Main Topics  . Smoking status: Former Smoker -- 1.00 packs/day for 25 years    Types: Cigarettes    Quit date: 03/21/1978  . Smokeless tobacco: Former Systems developer  . Alcohol Use: No  . Drug Use: No  . Sexual Activity: Not on file   Other Topics Concern  . Not on file   Social History Narrative    Married, children x 4 (3 girls )         Medication List       This list is accurate as of: 08/16/13 11:59 PM.  Always use your most recent med list.               aspirin 81 MG tablet  Take 81  mg by mouth daily.     budesonide-formoterol 160-4.5 MCG/ACT inhaler  Commonly known as:  SYMBICORT  Inhale 2 puffs into the lungs 2 (two) times daily. 2 puffs first thing in am, 2 puffs again in pm about 12 hours later.     ferrous sulfate 325 (65 FE) MG tablet  Commonly known as:  FERROUSUL  Take 1 tablet (325 mg total) by mouth 2 (two) times daily with a meal.     gemfibrozil 600 MG tablet  Commonly known as:  LOPID  TAKE 1 TABLET TWICE A DAY     levothyroxine 175 MCG tablet  Commonly known as:  SYNTHROID, LEVOTHROID  Take 175 mcg by mouth daily before breakfast.     metFORMIN 500 MG tablet  Commonly known as:  GLUCOPHAGE  TAKE 1 AND 1/2 TABLETS TWICE A DAY     multivitamin-iron-minerals-folic acid chewable tablet  Chew 1 tablet by mouth daily.     ONE TOUCH ULTRA TEST test strip  Generic drug:  glucose blood  CHECK BLOOD SUGAR AS DIRECTED.     traMADol 50 MG tablet  Commonly known as:  ULTRAM  Take 1 tablet (50 mg total)  by mouth every 6 (six) hours as needed.     valsartan-hydrochlorothiazide 320-25 MG per tablet  Commonly known as:  DIOVAN-HCT  Take 0.5 tablets by mouth daily.     verapamil 40 MG tablet  Commonly known as:  CALAN  Take 1 tablet (40 mg total) by mouth 3 (three) times daily.           Objective:   Physical Exam BP 132/67  Pulse 79  Temp(Src) 97.8 F (36.6 C) (Oral)  Wt 159 lb (72.122 kg)  SpO2 90%  General -- alert, well-developed, NAD.   Lungs -- normal respiratory effort, no intercostal retractions, no accessory muscle use, and decreased breath sounds.  Heart-- normal rate, regular rhythm, no murmur.  Extremities-- no pretibial edema bilaterally  Neurologic--  alert & oriented X3.  Psych-- Cognition and judgment appear intact. Cooperative with normal attention span and concentration. No anxious or depressed appearing.         Assessment & Plan:

## 2013-08-16 NOTE — Assessment & Plan Note (Signed)
Per pulmonary, planning a bronchoscopy

## 2013-08-16 NOTE — Progress Notes (Signed)
Pre-visit discussion using our clinic review tool. No additional management support is needed unless otherwise documented below in the visit note.  

## 2013-08-16 NOTE — Assessment & Plan Note (Signed)
Doing well on verapamil, no change

## 2013-08-16 NOTE — Assessment & Plan Note (Signed)
Mild iron deficiency anemia, on supplements. Planning a conservative approach, given other comorbidities, no workup for anemia planned  at this time. Plan: Labs

## 2013-08-29 ENCOUNTER — Telehealth: Payer: Self-pay | Admitting: Internal Medicine

## 2013-08-29 NOTE — Telephone Encounter (Signed)
Discussed in detail all the  indications, usual  risks and alternatives  relative to the benefits with patient who agrees to proceed with bronchoscopy with biopsy June 16

## 2013-08-29 NOTE — Telephone Encounter (Signed)
MW please advise on setting the pt up for biopsy.  He has been released from his orthopedic doctor for his broken shoulder.  thanks

## 2013-08-30 ENCOUNTER — Other Ambulatory Visit (HOSPITAL_COMMUNITY): Payer: Self-pay

## 2013-09-03 ENCOUNTER — Ambulatory Visit (HOSPITAL_COMMUNITY)
Admission: RE | Admit: 2013-09-03 | Discharge: 2013-09-03 | Disposition: A | Discharge status: Home / Self Care | Payer: Medicare Other | Source: Ambulatory Visit | Attending: Internal Medicine | Admitting: Internal Medicine

## 2013-09-03 ENCOUNTER — Encounter (HOSPITAL_COMMUNITY)
Admission: RE | Disposition: A | Discharge status: Home / Self Care | Payer: Self-pay | Source: Ambulatory Visit | Attending: Internal Medicine

## 2013-09-03 DIAGNOSIS — I10 Essential (primary) hypertension: Secondary | ICD-10-CM | POA: Insufficient documentation

## 2013-09-03 DIAGNOSIS — E039 Hypothyroidism, unspecified: Secondary | ICD-10-CM | POA: Insufficient documentation

## 2013-09-03 DIAGNOSIS — R918 Other nonspecific abnormal finding of lung field: Secondary | ICD-10-CM

## 2013-09-03 DIAGNOSIS — E785 Hyperlipidemia, unspecified: Secondary | ICD-10-CM | POA: Insufficient documentation

## 2013-09-03 DIAGNOSIS — J438 Other emphysema: Secondary | ICD-10-CM | POA: Insufficient documentation

## 2013-09-03 DIAGNOSIS — K5730 Diverticulosis of large intestine without perforation or abscess without bleeding: Secondary | ICD-10-CM | POA: Insufficient documentation

## 2013-09-03 DIAGNOSIS — Z9981 Dependence on supplemental oxygen: Secondary | ICD-10-CM | POA: Insufficient documentation

## 2013-09-03 DIAGNOSIS — R222 Localized swelling, mass and lump, trunk: Secondary | ICD-10-CM | POA: Insufficient documentation

## 2013-09-03 DIAGNOSIS — E119 Type 2 diabetes mellitus without complications: Secondary | ICD-10-CM | POA: Insufficient documentation

## 2013-09-03 HISTORY — PX: VIDEO BRONCHOSCOPY: SHX5072

## 2013-09-03 LAB — GLUCOSE, CAPILLARY: Glucose-Capillary: 102 mg/dL — ABNORMAL HIGH (ref 70–99)

## 2013-09-03 SURGERY — VIDEO BRONCHOSCOPY WITHOUT FLUORO
Anesthesia: Moderate Sedation | Laterality: Bilateral

## 2013-09-03 MED ORDER — MIDAZOLAM HCL 10 MG/2ML IJ SOLN
INTRAMUSCULAR | Status: AC
  Filled 2013-09-03: qty 4

## 2013-09-03 MED ORDER — LIDOCAINE HCL 1 % IJ SOLN
INTRAMUSCULAR | Status: DC | PRN
  Administered 2013-09-03: 6 mL via RESPIRATORY_TRACT

## 2013-09-03 MED ORDER — MEPERIDINE HCL 100 MG/ML IJ SOLN: 100.0000 mg | Freq: Once | INTRAMUSCULAR | Status: DC

## 2013-09-03 MED ORDER — PHENYLEPHRINE HCL 0.25 % NA SOLN: 1.0000 | Freq: Four times a day (QID) | NASAL | Status: DC | PRN

## 2013-09-03 MED ORDER — MIDAZOLAM HCL 10 MG/2ML IJ SOLN
INTRAMUSCULAR | Status: DC | PRN
  Administered 2013-09-03: 2.5 mg via INTRAVENOUS

## 2013-09-03 MED ORDER — MEPERIDINE HCL 100 MG/ML IJ SOLN
INTRAMUSCULAR | Status: AC
  Filled 2013-09-03: qty 2

## 2013-09-03 MED ORDER — LIDOCAINE HCL 2 % EX GEL
CUTANEOUS | Status: DC | PRN
  Administered 2013-09-03: 1

## 2013-09-03 MED ORDER — PHENYLEPHRINE HCL 0.25 % NA SOLN
NASAL | Status: DC | PRN
  Administered 2013-09-03: 2 via NASAL

## 2013-09-03 MED ORDER — SODIUM CHLORIDE 0.9 % IV SOLN
INTRAVENOUS | Status: DC
  Administered 2013-09-03: 08:00:00 via INTRAVENOUS

## 2013-09-03 MED ORDER — MIDAZOLAM HCL 5 MG/ML IJ SOLN
1.0000 mg | Freq: Once | INTRAMUSCULAR | Status: DC
Start: 2013-09-03 — End: 2013-09-05

## 2013-09-03 MED ORDER — LIDOCAINE HCL 2 % EX GEL: Freq: Once | CUTANEOUS | Status: DC

## 2013-09-03 NOTE — Progress Notes (Signed)
Video bronchoscopy performed Intervention bronchial washings Intervention bronchial biopsy Pt tolerated well  Kathie Dike RRT

## 2013-09-03 NOTE — H&P (Signed)
Brief patient profile:  82 yowm quit smoking 1980 documented GOLD II copd 2010  on maint symbicort and 02 just at bedtime at baseline referred 07/03/13 to pulmonary clinic for eval of R lung mass by EDP p fell and shoulder xrays showeed lung mass      History of Present Illness   07/03/2013 1st [facility] office visit in [place]   Chief Complaint   Patient presents with   .  Pulmonary Consult       Pt seen here in the past for Emphysema. He was referred back per [facility] ED p shoulder fx on 06/19/13 and had ct chest significant for lung mass. He denies any respiratory co's today.     baseline  = able to walk 2 miles at [place] stopping but becoming much more frail in last year, unsteady on feet with freq falls. No sob at present but very sedentary, no pain with deep breathing or new cough or h/o hemoptysis.   rec Should you cough up any blood stop aspirin       08/05/2013 f/u ov/Wert re: copd/ lung mass Chief Complaint   Patient presents with   .  Follow-up       Pt states that his breathing is doing well. He denies any new co's today.     no cp, hemoptysis, no need for saba Not limited by breathing from desired activities  But very sedentary   No obvious day to day or daytime variabilty or assoc chronic cough or cp or chest tightness, subjective wheeze overt sinus or hb symptoms. No unusual exp hx or h/o childhood pna/ asthma or knowledge of premature birth.   Sleeping ok without nocturnal  or early am exacerbation  of respiratory  c/o's or need for noct saba. Also denies any obvious fluctuation of symptoms with weather or environmental changes or other aggravating or alleviating factors except as outlined above    Current Medications, Allergies, Complete Past Medical History, Past Surgical History, Family History, and Social History were reviewed in [employer] record.   ROS  The following are not active complaints unless bolded sore throat,  dysphagia, dental problems, itching, sneezing,  nasal congestion or excess/ purulent secretions, ear ache,   fever, chills, sweats, unintended wt loss, pleuritic or exertional cp, hemoptysis,  orthopnea pnd or leg swelling, presyncope, palpitations, heartburn, abdominal pain, anorexia, nausea, vomiting, diarrhea  or change in bowel or urinary habits, change in stools or urine, dysuria,hematuria,  rash, arthralgias, visual complaints, headache, numbness weakness or ataxia or problems with walking or coordination,  change in mood/affect or memory.                       Past Medical History:   COPD--on night O2   - PFT's 10/17/2008 FEV1 1.31.( 62%) ratio 36   Diabetes mellitus, type II   Hyperlipidemia   hypothyroidism   Hypertension   - DC ACE February 09, 2009 (cough)   Diverticulosis, colon   gallstones   DIVERTICULOSIS, COLON                Objective:     Physical Exam   amb male nad    08/05/2013       161   Wt Readings from Last 3 Encounters:   07/03/13  167 lb (75.751 kg)   06/20/13  165 lb (74.844 kg)   06/19/13  167 lb (75.751 kg)  HEENT mild turbinate edema.  Oropharynx no thrush or excess pnd or cobblestoning.  No JVD or cervical adenopathy. Mild accessory muscle hypertrophy. Trachea midline, nl thryroid. Chest was hyperinflated by percussion with diminished breath sounds and moderate increased exp time without wheeze. Hoover sign positive at mid inspiration. Regular rate and rhythm without murmur gallop or rub or increase P2 or edema.  Abd: no hsm, nl excursion. Ext warm without cyanosis or clubbing.         06/19/13 9.5 cm rounded mass over the right upper lobe suspicious for primary   bronchogenic neoplasm. Recommend chest CT with contrast for further   evaluation.   Degenerative change of the spine with mild compression of an upper   thoracic vertebral body.   Known right proximal humeral fracture.               Assessment &  Plan:               Lung mass -     - See CT chest 4/1/5 - PET  07/08/13 >  C/w T1N1M0 lung ca   I had an extended discussion with the patient and daughter Janace Hoard  today lasting 15 to 20 minutes of a 25 minute visit on the following issues:    This is not resectable for cure but could be palliated with RT and or chemo after tissue dx first which is most amenable to FOB   Discussed in detail all the  indications, usual  risks and alternatives  relative to the benefits with patient who agrees to proceed with bronchoscopy with biopsy but wants to wait until he gets over his shoulder surgery and reluctant to undergo treatment of any kind until more symptomatic so no rush for tissue dx anyway,  And fm supports his decision.         : COPD GOLD II/ noct 02     - PFT's 10/17/2008 FEV1 1.31.( 62%) ratio 36    Adequate control on present rx, reviewed > no change in rx needed  > symbicort 160 2bid   09/03/2013 day of FOB: no new complaints or findings, agrees to proceed   Christinia Gully, MD Pulmonary and [place] [phone] After 5:30 PM or weekends, call [phone]

## 2013-09-03 NOTE — Op Note (Signed)
Bronchoscopy Procedure Note  Date of Operation: 09/03/2013   Pre-op Diagnosis: lung ca  Post-op Diagnosis: same  Surgeon: Christinia Gully  Anesthesia: Monitored Local Anesthesia with Sedation  Operation: Video Flexible fiberoptic bronchoscopy, diagnostic   Findings: endobronchial obst RUL ant 100%  Specimen: washings/endobronchial bx RUL  Estimated Blood Loss: min   Complications: none  Indications and History: See updated H and P same date. The risks, benefits, complications, treatment options and expected outcomes were discussed with the patient.  The possibilities of reaction to medication, pulmonary aspiration, perforation of a viscus, bleeding, failure to diagnose a condition and creating a complication requiring transfusion or operation were discussed with the patient who freely signed the consent.    Description of Procedure: The patient was re-examined in the bronchoscopy suite and the site of surgery properly noted/marked.  The patient was identified  and the procedure verified as Flexible Fiberoptic Bronchoscopy.  A Time Out was held and the above information confirmed.   After the induction of topical nasopharyngeal anesthesia, the patient was positioned  and the bronchoscope was passed through the R naris. The vocal cords were visualized and  1% buffered lidocaine 5 ml was topically placed onto the cords. The cords were Nl. The scope was then passed into the trachea.  1% buffered lidocaine given topically. Airways inspected bilaterally to the subsegmental level with the following findings:  All airways opened widely to the subsegmental level bilaterally except for the RUL ant segment completely obst by fungating mass  Interventions Washings/ endobronchial bx x 3      The Patient was taken to the Endoscopy Recovery area in satisfactory condition.  Attestation: I performed the procedure.  Christinia Gully, MD Pulmonary and [place]  [phone] After 5:30 PM or weekends, call [phone]

## 2013-09-03 NOTE — Discharge Instructions (Signed)
Flexible Bronchoscopy, Care After These instructions give you information on caring for yourself after your procedure. Your doctor may also give you more specific instructions. Call your doctor if you have any problems or questions after your procedure. HOME CARE  Do not eat or drink anything for 2 hours after your procedure. If you try to eat or drink before the medicine wears off, food or drink could go into your lungs. You could also burn yourself.  After 2 hours have passed and when you can cough and gag normally, you may eat soft food and drink liquids slowly.  The day after the test, you may eat your normal diet.  You may do your normal activities.  Keep all doctor visits. GET HELP RIGHT AWAY IF:  You get more and more short of breath.  You get lightheaded.  You feel like you are going to pass out (faint).  You have chest pain.  You have new problems that worry you.  You cough up more than a little blood.  You cough up more blood than before. MAKE SURE YOU:  Understand these instructions.  Will watch your condition.  Will get help right away if you are not doing well or get worse. Document Released: 01/02/2009 Document Revised: 12/26/2012 Document Reviewed: 11/09/2012 [facility] Patient Information 2014 [place].  Do not eat or drink until after 1020 today 09/03/2013   Ok to resume aspirin Friday the 19th but stop anytime any bleeding is noted until 3 straight days of no bleeding then ok to resume

## 2013-09-04 ENCOUNTER — Encounter (HOSPITAL_COMMUNITY): Payer: Self-pay | Admitting: Internal Medicine

## 2013-09-04 ENCOUNTER — Telehealth: Payer: Self-pay | Admitting: Internal Medicine

## 2013-09-04 DIAGNOSIS — R918 Other nonspecific abnormal finding of lung field: Secondary | ICD-10-CM

## 2013-09-04 NOTE — Telephone Encounter (Signed)
Set up for [facility] clinic  Informed pt of lung ca, not operable due to bad breathing

## 2013-09-04 NOTE — Telephone Encounter (Signed)
Please advise MW thanks 

## 2013-09-05 ENCOUNTER — Telehealth: Payer: Self-pay | Admitting: Internal Medicine

## 2013-09-05 ENCOUNTER — Telehealth: Payer: Self-pay | Admitting: *Deleted

## 2013-09-05 NOTE — Telephone Encounter (Signed)
Yes that's fine 

## 2013-09-05 NOTE — Telephone Encounter (Signed)
I spoke with the pt and he wanted to notify Dr. Melvyn Novas that Dr. Larose Kells has recommended that he start taking verapamil 40mg  daily. The pt wanted to make sure Dr. Melvyn Novas knew about this before he starts this medication. I advised I will send MW a message notifying him of this. Minor Bing, CMA

## 2013-09-05 NOTE — Telephone Encounter (Signed)
I called made pt aware. Nothing further needed 

## 2013-09-05 NOTE — Telephone Encounter (Signed)
Called phone busy.  Will call back

## 2013-09-05 NOTE — Telephone Encounter (Signed)
Called and spoke to pt. Informed pt of the referral to [facility] and that someone will be calling him to get it set up. Pt verbalized understanding and denied any further questions or concerns at this time. Order ahs been placed.

## 2013-09-06 ENCOUNTER — Telehealth: Payer: Self-pay | Admitting: *Deleted

## 2013-09-06 NOTE — Telephone Encounter (Signed)
Called pt with appt 09/16/13 at 11:15labs and 11:30 with Dr. Julien Nordmann.  He verbalized understanding of appt time and place.

## 2013-09-13 ENCOUNTER — Other Ambulatory Visit: Payer: Self-pay | Admitting: *Deleted

## 2013-09-13 DIAGNOSIS — R918 Other nonspecific abnormal finding of lung field: Secondary | ICD-10-CM

## 2013-09-16 ENCOUNTER — Other Ambulatory Visit (HOSPITAL_BASED_OUTPATIENT_CLINIC_OR_DEPARTMENT_OTHER): Payer: Medicare Other

## 2013-09-16 ENCOUNTER — Telehealth: Payer: Self-pay | Admitting: Internal Medicine

## 2013-09-16 ENCOUNTER — Ambulatory Visit (HOSPITAL_BASED_OUTPATIENT_CLINIC_OR_DEPARTMENT_OTHER): Payer: Medicare Other | Admitting: Internal Medicine

## 2013-09-16 ENCOUNTER — Encounter: Payer: Self-pay | Admitting: Internal Medicine

## 2013-09-16 ENCOUNTER — Telehealth: Payer: Self-pay | Admitting: *Deleted

## 2013-09-16 VITALS — BP 131/51 | HR 101 | Temp 97.7°F | Resp 19 | Ht 66.5 in | Wt 156.9 lb

## 2013-09-16 DIAGNOSIS — C3411 Malignant neoplasm of upper lobe, right bronchus or lung: Secondary | ICD-10-CM

## 2013-09-16 DIAGNOSIS — C3410 Malignant neoplasm of upper lobe, unspecified bronchus or lung: Secondary | ICD-10-CM

## 2013-09-16 DIAGNOSIS — R918 Other nonspecific abnormal finding of lung field: Secondary | ICD-10-CM

## 2013-09-16 DIAGNOSIS — C3490 Malignant neoplasm of unspecified part of unspecified bronchus or lung: Secondary | ICD-10-CM

## 2013-09-16 HISTORY — DX: Malignant neoplasm of unspecified part of unspecified bronchus or lung: C34.90

## 2013-09-16 LAB — COMPREHENSIVE METABOLIC PANEL (CC13)
ALK PHOS: 74 U/L (ref 40–150)
ALT: 15 U/L (ref 0–55)
AST: 17 U/L (ref 5–34)
Albumin: 2.5 g/dL — ABNORMAL LOW (ref 3.5–5.0)
Anion Gap: 11 mEq/L (ref 3–11)
BUN: 24.8 mg/dL (ref 7.0–26.0)
CALCIUM: 11.7 mg/dL — AB (ref 8.4–10.4)
CO2: 27 mEq/L (ref 22–29)
CREATININE: 1.1 mg/dL (ref 0.7–1.3)
Chloride: 104 mEq/L (ref 98–109)
GLUCOSE: 147 mg/dL — AB (ref 70–140)
Potassium: 4.1 mEq/L (ref 3.5–5.1)
Sodium: 143 mEq/L (ref 136–145)
Total Bilirubin: 0.25 mg/dL (ref 0.20–1.20)
Total Protein: 7.3 g/dL (ref 6.4–8.3)

## 2013-09-16 LAB — CBC WITH DIFFERENTIAL/PLATELET
BASO%: 0.2 % (ref 0.0–2.0)
BASOS ABS: 0 10*3/uL (ref 0.0–0.1)
EOS ABS: 0 10*3/uL (ref 0.0–0.5)
EOS%: 0.2 % (ref 0.0–7.0)
HCT: 34.1 % — ABNORMAL LOW (ref 38.4–49.9)
HGB: 10.6 g/dL — ABNORMAL LOW (ref 13.0–17.1)
LYMPH%: 4 % — ABNORMAL LOW (ref 14.0–49.0)
MCH: 28.9 pg (ref 27.2–33.4)
MCHC: 31.1 g/dL — ABNORMAL LOW (ref 32.0–36.0)
MCV: 92.9 fL (ref 79.3–98.0)
MONO#: 1.2 10*3/uL — ABNORMAL HIGH (ref 0.1–0.9)
MONO%: 7.3 % (ref 0.0–14.0)
NEUT#: 14.2 10*3/uL — ABNORMAL HIGH (ref 1.5–6.5)
NEUT%: 88.3 % — AB (ref 39.0–75.0)
PLATELETS: 409 10*3/uL — AB (ref 140–400)
RBC: 3.67 10*6/uL — ABNORMAL LOW (ref 4.20–5.82)
RDW: 14.6 % (ref 11.0–14.6)
WBC: 16.1 10*3/uL — ABNORMAL HIGH (ref 4.0–10.3)
lymph#: 0.7 10*3/uL — ABNORMAL LOW (ref 0.9–3.3)

## 2013-09-16 NOTE — Telephone Encounter (Signed)
Per staff message and POF I have scheduled appts. Advised scheduler of appts. JMW  

## 2013-09-16 NOTE — Telephone Encounter (Signed)
lvm for pt regarding to 7.7.15 appt time change

## 2013-09-16 NOTE — Telephone Encounter (Signed)
gv adn printed appt sched and avs for pt fro July...sed added tx....emailed mw to add tx.

## 2013-09-16 NOTE — Progress Notes (Signed)
[facility] [phone]   [phone]  CONSULT NOTE  REFERRING PHYSICIAN: Dr. Christinia Gully  REASON FOR CONSULTATION:  80 years old white male recently diagnosed with lung cancer  HPI Kenneth Chan is a 80 y.o. male with past medical history significant for COPD, diabetes mellitus, hypertension, dyslipidemia as well as history of smoking but quit 20 years ago. The patient has a fall in early April 2015 and he presented to the emergency Department for evaluation. XR of the right shoulder on 06/19/2013 showed large mass in the mid right lung consistent with carcinoma. Chest x-ray and CT scan of the chest were performed on the same day and it showed 7.8 x 7.1 x 7.4 CM heterogeneous lobulated right upper lobe mass, most compatible with primary bronchogenic carcinoma. There was also 1.0 CM right hilar lymph node and no other mediastinal adenopathy. The patient was referred to Dr. Melvyn Novas and a PET scan was performed on 07/08/2013 and it showed Hypermetabolic right upper lobe mass measures 6.9 x 8.0 cm with intense peripheral metabolic activity (SUV max 16.2). This mass surrounds the right upper lobe bronchus. There are no hypermetabolic mediastinal lymph nodes. There is likely hypermetabolic right hilar lymph node which is contiguous with the mass. There was no evidence of mediastinal hypermetabolic nodes or distant metastasis. There was bilateral adrenal adenomas.  On 09/03/2013, the patient underwent video flexible fiberoptic bronchoscopy under the care of Dr. Melvyn Novas.  The final cytology (Accession: [id number]) from the bronchial lavage showed malignant cells consistent with non-small cell carcinoma favoring squamous cell carcinoma. Dr. Melvyn Novas kindly referred the patient to me today for evaluation and discussion of his treatment options. When seen today the patient is feeling fine with no specific complaints except for dry cough and weight loss of 15 pounds over the last 2  months. He denied having any significant chest pain, shortness breath or hemoptysis. He denied having any significant nausea or vomiting. He has no headache or visual changes. Family history significant for her mother with heart attack and father died from metastatic prostate cancer. The patient is married and has 4 children. He was accompanied today by his daughter, Kenneth Chan and his son Kenneth Chan. He was to look for a vending company. He has a history of smoking one pack per day for over 40 years but quit 20 years ago. He has no history of alcohol or drug abuse. HPI  Past Medical History  Diagnosis Date  . COPD (chronic obstructive pulmonary disease)     on night 02, PFT's 10/17/2008 FEV1 1.31. (62%) ratio 36 -HFA 50% January 31,2011  . DM type 2 (diabetes mellitus, type 2)   . Hyperlipidemia   . Hypothyroidism   . Hypertension     DC ace 02/09/09 (cough) > resolved  . Diverticulosis of colon   . Gallstones   . Lung mass 06-2013  . 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 Shoulder fracture 06-2013  . Lung cancer 09/16/2013    Past Surgical History  Procedure Laterality Date  . Inguinal hernia repair    . Cataract extraction    . Video bronchoscopy Bilateral 09/03/2013    Procedure: VIDEO BRONCHOSCOPY WITHOUT FLUORO;  Surgeon: Tanda Rockers, MD;  Location: [facility];  Service: Cardiopulmonary;  Laterality: Bilateral;    Family History  Problem Relation Age of Onset  . Hypertension Mother   . Coronary artery disease Mother     MI  . Diabetes Neg Hx   . Stroke Neg  Hx   . Cancer Neg Hx     colon and prostate  . Prostate cancer Paternal Grandfather     with mets to Bone     Social History History  Substance Use Topics  . Smoking status: Former Smoker -- 1.00 packs/day for 25 years    Types: Cigarettes    Quit date: 03/21/1978  . Smokeless tobacco: Former Systems developer  . Alcohol Use: No    No Known Allergies  Current Outpatient Prescriptions  Medication Sig Dispense Refill  .  budesonide-formoterol (SYMBICORT) 160-4.5 MCG/ACT inhaler Inhale 2 puffs into the lungs 2 (two) times daily. 2 puffs first thing in am, 2 puffs again in pm about 12 hours later.  1 Inhaler  5  . ferrous sulfate (FERROUSUL) 325 (65 FE) MG tablet Take 1 tablet (325 mg total) by mouth 2 (two) times daily with a meal.  60 tablet  2  . gemfibrozil (LOPID) 600 MG tablet TAKE 1 TABLET TWICE A DAY  60 tablet  2  . levothyroxine (SYNTHROID, LEVOTHROID) 175 MCG tablet Take 175 mcg by mouth daily before breakfast.      . metFORMIN (GLUCOPHAGE) 500 MG tablet TAKE 1 AND 1/2 TABLETS TWICE A DAY  90 tablet  5  . multivitamin-iron-minerals-folic acid (CENTRUM) chewable tablet Chew 1 tablet by mouth daily.        . ONE TOUCH ULTRA TEST test strip CHECK BLOOD SUGAR AS DIRECTED.  100 each  5  . traMADol (ULTRAM) 50 MG tablet Take 1 tablet (50 mg total) by mouth every 6 (six) hours as needed.  20 tablet  0  . valsartan-hydrochlorothiazide (DIOVAN-HCT) 320-25 MG per tablet Take 0.5 tablets by mouth daily.       . verapamil (CALAN) 40 MG tablet Take 1 tablet (40 mg total) by mouth 3 (three) times daily.  90 tablet  3   No current facility-administered medications for this visit.    Review of Systems  Constitutional: positive for fatigue and weight loss Eyes: negative Ears, nose, mouth, throat, and face: negative Respiratory: positive for cough Cardiovascular: negative Gastrointestinal: negative Genitourinary:negative Integument/breast: negative Hematologic/lymphatic: negative Musculoskeletal:negative Neurological: negative Behavioral/Psych: negative Endocrine: negative Allergic/Immunologic: negative  Physical Exam  YTK:PTWSF, healthy, no distress, well nourished and well developed SKIN: skin color, texture, turgor are normal, no rashes or significant lesions HEAD: Normocephalic, No masses, lesions, tenderness or abnormalities EYES: normal, PERRLA EARS: External ears normal, Canals clear OROPHARYNX:no  exudate, no erythema and lips, buccal mucosa, and tongue normal  NECK: supple, no adenopathy, no JVD LYMPH:  no palpable lymphadenopathy, no hepatosplenomegaly LUNGS: clear to auscultation , and palpation HEART: regular rate & rhythm, no murmurs and no gallops ABDOMEN:abdomen soft, non-tender, normal bowel sounds and no masses or organomegaly BACK: Back symmetric, no curvature., No CVA tenderness EXTREMITIES:no joint deformities, effusion, or inflammation, no edema, no skin discoloration  NEURO: alert & oriented x 3 with fluent speech, no focal motor/sensory deficits  PERFORMANCE STATUS: ECOG 1-2  LABORATORY DATA: Lab Results  Component Value Date   WBC 16.1 Repeated and Verified* 09/16/2013   HGB 10.6 Repeated and Verified* 09/16/2013   HCT 34.1* 09/16/2013   MCV 92.9 09/16/2013   PLT 409* 09/16/2013      Chemistry      Component Value Date/Time   NA 143 09/16/2013 1102   NA 144 06/19/2013 1145   Chan 4.1 09/16/2013 1102   Chan 4.6 06/19/2013 1145   CL 105 06/19/2013 1145   CO2 27 09/16/2013 1102  CO2 23 06/19/2013 1145   BUN 24.8 09/16/2013 1102   BUN 25* 06/19/2013 1145   CREATININE 1.1 09/16/2013 1102   CREATININE 1.14 06/19/2013 1145      Component Value Date/Time   CALCIUM 11.7* 09/16/2013 1102   CALCIUM 10.4 06/19/2013 1145   ALKPHOS 74 09/16/2013 1102   ALKPHOS 70 06/19/2013 1145   AST 17 09/16/2013 1102   AST 20 06/19/2013 1145   ALT 15 09/16/2013 1102   ALT 9 06/19/2013 1145   BILITOT 0.25 09/16/2013 1102   BILITOT 0.5 06/19/2013 1145       RADIOGRAPHIC STUDIES: CLINICAL DATA: Initial treatment strategy for right lung mass.  EXAM:  NUCLEAR MEDICINE PET SKULL BASE TO THIGH  TECHNIQUE:  8.4 mCi F-18 FDG was injected intravenously. Full-ring PET imaging  was performed from the skull base to thigh after the radiotracer. CT  data was obtained and used for attenuation correction and anatomic  localization.  FASTING BLOOD GLUCOSE: Value: 100 mg/dl  COMPARISON: None.  FINDINGS:  NECK  No  hypermetabolic lymph nodes in the neck.  CHEST  Hypermetabolic right upper lobe mass measures 6.9 x 8.0 cm with  intense peripheral metabolic activity (SUV max 16.2). This mass  surrounds the right upper lobe bronchus.  There are no hypermetabolic mediastinal lymph nodes. There is likely  hypermetabolic right hilar lymph node which is contiguous with the  mass.  There is extensive centrilobular emphysema with bullous change in  the right upper lobe.  ABDOMEN/PELVIS  There is mild metabolic activity associated with bilateral adrenal  nodularity. Adrenal glands have Hounsfield units less than 10  consistent with benign adenomas. The adrenal metabolic activity is  similar to liver activity.  There is a large low-density lesion in the right hepatic lobe which  does not have metabolic activity. No hypermetabolic abdominal pelvic  lymph nodes. The gallbladder is contracted around several  gallstones. There is intense metabolic activity associated with the  sigmoid colon in a region of multiple diverticula.  SKELETON  No focal hypermetabolic activity to suggest skeletal metastasis.  IMPRESSION:  1. Hypermetabolic right upper lobe mass.  2. Hypermetabolic right hilar lymph node.  3. No evidence of mediastinal hypermetabolic nodes or distant  metastasis.  4. Bilateral adrenal adenomas.  5. Staging by FDG PET imaging T3 N1 M0  Electronically Signed  By: Suzy Bouchard M.D.  On: 07/08/2013 10:05  ASSESSMENT: This is a very pleasant 80 years old white male recently diagnosed with IIIA (T3, N1, M0) non-small cell lung cancer, squamous cell carcinoma diagnosed in June of 2015 presented with large right upper lobe lung mass in addition to right hilar lymphadenopathy.   PLAN: I had a lengthy discussion with the patient and his family today about his current disease stage, prognosis and treatment options. I will complete the staging workup by ordering an MRI of the brain to rule out brain  metastasis. I discussed with the patient his treatment options and explained to him that the standard treatment for unresectable stage IIIA non-small cell lung cancer is a course of concurrent chemoradiation. The patient has a large mass with cavitation on the right upper lobe. His condition was discussed at the weekly thoracic conference and radiation oncology for that the patient may benefit from a course of systemic chemotherapy before consideration of concurrent radiation today to decrease the field of radiation and also to prevent further cavitation of the tumor. I discussed this option with the patient and recommended for him treatment with systemic chemotherapy  in the form of carboplatin for AUC of 5 and paclitaxel 175 mg/M2 every 3 weeks with Neulasta support. I discussed with the patient adverse effect of the chemotherapy including but not limited to alopecia, myelosuppression, nausea and vomiting, peripheral neuropathy, liver or renal dysfunction. The patient would like to proceed with the treatment as planned and he gives a verbal consent for the treatment.  He is expected to start the first cycle of this treatment next week. I will arrange for the patient to have a chemotherapy education class before starting the first cycle of his chemotherapy. I will Escribe Compazine 10 mg by mouth every 6 hours as needed for nausea. He would come back for followup visit in 2 weeks for reevaluation and management any adverse effect of his treatment. I gave the patient and his family the time to ask questions and I answered them completely to their satisfaction. He was advised to call immediately if he has any concerning symptoms in the interval. The patient voices understanding of current disease status and treatment options and is in agreement with the current care plan.  All questions were answered. The patient knows to call the clinic with any problems, questions or concerns. We can certainly see the  patient much sooner if necessary.  Thank you so much for allowing me to participate in the care of Kenneth Chan. I will continue to follow up the patient with you and assist in his care.  I spent 55 minutes counseling the patient face to face. The total time spent in the appointment was 80 minutes.  Disclaimer: This note was dictated with voice recognition software. Similar sounding words can inadvertently be transcribed and may not be corrected upon review.   Kenneth Chan,Kenneth Chan. 09/16/2013, 12:44 PM

## 2013-09-18 ENCOUNTER — Encounter: Payer: Self-pay | Admitting: *Deleted

## 2013-09-18 ENCOUNTER — Other Ambulatory Visit: Payer: Medicare Other

## 2013-09-19 ENCOUNTER — Other Ambulatory Visit: Payer: Self-pay | Admitting: *Deleted

## 2013-09-19 DIAGNOSIS — C3410 Malignant neoplasm of upper lobe, unspecified bronchus or lung: Secondary | ICD-10-CM

## 2013-09-19 MED ORDER — PROCHLORPERAZINE MALEATE 10 MG PO TABS: 10.0000 mg | ORAL_TABLET | Freq: Four times a day (QID) | ORAL | Status: DC | PRN

## 2013-09-23 ENCOUNTER — Other Ambulatory Visit: Payer: Self-pay | Admitting: Internal Medicine

## 2013-09-23 DIAGNOSIS — E039 Hypothyroidism, unspecified: Secondary | ICD-10-CM

## 2013-09-23 DIAGNOSIS — D509 Iron deficiency anemia, unspecified: Secondary | ICD-10-CM

## 2013-09-23 NOTE — Telephone Encounter (Signed)
Refill for Synthroid and FeSol sent to [facility] on [address]

## 2013-09-24 ENCOUNTER — Ambulatory Visit (HOSPITAL_BASED_OUTPATIENT_CLINIC_OR_DEPARTMENT_OTHER): Payer: Medicare Other

## 2013-09-24 ENCOUNTER — Other Ambulatory Visit: Payer: Medicare Other

## 2013-09-24 ENCOUNTER — Other Ambulatory Visit: Payer: Self-pay | Admitting: *Deleted

## 2013-09-24 ENCOUNTER — Other Ambulatory Visit (HOSPITAL_BASED_OUTPATIENT_CLINIC_OR_DEPARTMENT_OTHER): Payer: Medicare Other

## 2013-09-24 ENCOUNTER — Ambulatory Visit (HOSPITAL_COMMUNITY)
Admission: RE | Admit: 2013-09-24 | Discharge: 2013-09-24 | Disposition: A | Discharge status: Home / Self Care | Payer: Medicare Other | Source: Ambulatory Visit | Attending: Internal Medicine | Admitting: Internal Medicine

## 2013-09-24 VITALS — BP 149/59 | HR 82 | Temp 98.5°F | Resp 18

## 2013-09-24 DIAGNOSIS — C3410 Malignant neoplasm of upper lobe, unspecified bronchus or lung: Secondary | ICD-10-CM

## 2013-09-24 DIAGNOSIS — G319 Degenerative disease of nervous system, unspecified: Secondary | ICD-10-CM | POA: Insufficient documentation

## 2013-09-24 DIAGNOSIS — C3411 Malignant neoplasm of upper lobe, right bronchus or lung: Secondary | ICD-10-CM

## 2013-09-24 DIAGNOSIS — Z5111 Encounter for antineoplastic chemotherapy: Secondary | ICD-10-CM

## 2013-09-24 DIAGNOSIS — C3490 Malignant neoplasm of unspecified part of unspecified bronchus or lung: Secondary | ICD-10-CM | POA: Insufficient documentation

## 2013-09-24 LAB — COMPREHENSIVE METABOLIC PANEL (CC13)
ALBUMIN: 2.1 g/dL — AB (ref 3.5–5.0)
ALK PHOS: 85 U/L (ref 40–150)
ALT: 38 U/L (ref 0–55)
ANION GAP: 15 meq/L — AB (ref 3–11)
AST: 25 U/L (ref 5–34)
BUN: 17.2 mg/dL (ref 7.0–26.0)
CALCIUM: 11 mg/dL — AB (ref 8.4–10.4)
CO2: 26 meq/L (ref 22–29)
Chloride: 101 mEq/L (ref 98–109)
Creatinine: 0.9 mg/dL (ref 0.7–1.3)
Glucose: 143 mg/dl — ABNORMAL HIGH (ref 70–140)
POTASSIUM: 4.5 meq/L (ref 3.5–5.1)
SODIUM: 142 meq/L (ref 136–145)
TOTAL PROTEIN: 7.2 g/dL (ref 6.4–8.3)
Total Bilirubin: 0.21 mg/dL (ref 0.20–1.20)

## 2013-09-24 LAB — CBC WITH DIFFERENTIAL/PLATELET
BASO%: 0.6 % (ref 0.0–2.0)
BASOS ABS: 0.1 10*3/uL (ref 0.0–0.1)
EOS ABS: 0 10*3/uL (ref 0.0–0.5)
EOS%: 0.1 % (ref 0.0–7.0)
HCT: 33.1 % — ABNORMAL LOW (ref 38.4–49.9)
HGB: 10.6 g/dL — ABNORMAL LOW (ref 13.0–17.1)
LYMPH%: 2.6 % — ABNORMAL LOW (ref 14.0–49.0)
MCH: 28.6 pg (ref 27.2–33.4)
MCHC: 31.9 g/dL — ABNORMAL LOW (ref 32.0–36.0)
MCV: 89.7 fL (ref 79.3–98.0)
MONO#: 1.2 10*3/uL — AB (ref 0.1–0.9)
MONO%: 5 % (ref 0.0–14.0)
NEUT%: 91.7 % — ABNORMAL HIGH (ref 39.0–75.0)
NEUTROS ABS: 21.9 10*3/uL — AB (ref 1.5–6.5)
PLATELETS: 596 10*3/uL — AB (ref 140–400)
RBC: 3.69 10*6/uL — AB (ref 4.20–5.82)
RDW: 15.7 % — AB (ref 11.0–14.6)
WBC: 23.9 10*3/uL — AB (ref 4.0–10.3)
lymph#: 0.6 10*3/uL — ABNORMAL LOW (ref 0.9–3.3)

## 2013-09-24 LAB — TECHNOLOGIST REVIEW

## 2013-09-24 MED ORDER — FAMOTIDINE IN NACL 20-0.9 MG/50ML-% IV SOLN
INTRAVENOUS | Status: AC
  Filled 2013-09-24: qty 50

## 2013-09-24 MED ORDER — FAMOTIDINE IN NACL 20-0.9 MG/50ML-% IV SOLN
20.0000 mg | Freq: Once | INTRAVENOUS | Status: AC
  Administered 2013-09-24: 20 mg via INTRAVENOUS

## 2013-09-24 MED ORDER — DEXAMETHASONE SODIUM PHOSPHATE 20 MG/5ML IJ SOLN
INTRAMUSCULAR | Status: AC
  Filled 2013-09-24: qty 5

## 2013-09-24 MED ORDER — DEXAMETHASONE SODIUM PHOSPHATE 20 MG/5ML IJ SOLN
20.0000 mg | Freq: Once | INTRAMUSCULAR | Status: AC
  Administered 2013-09-24: 20 mg via INTRAVENOUS

## 2013-09-24 MED ORDER — DIPHENHYDRAMINE HCL 50 MG/ML IJ SOLN
INTRAMUSCULAR | Status: AC
  Filled 2013-09-24: qty 1

## 2013-09-24 MED ORDER — DIPHENHYDRAMINE HCL 50 MG/ML IJ SOLN
50.0000 mg | Freq: Once | INTRAMUSCULAR | Status: AC
  Administered 2013-09-24: 50 mg via INTRAVENOUS

## 2013-09-24 MED ORDER — SODIUM CHLORIDE 0.9 % IV SOLN
Freq: Once | INTRAVENOUS | Status: AC
  Administered 2013-09-24: 12:00:00 via INTRAVENOUS

## 2013-09-24 MED ORDER — ONDANSETRON 16 MG/50ML IVPB (CHCC)
16.0000 mg | Freq: Once | INTRAVENOUS | Status: AC
  Administered 2013-09-24: 16 mg via INTRAVENOUS

## 2013-09-24 MED ORDER — ONDANSETRON 16 MG/50ML IVPB (CHCC)
INTRAVENOUS | Status: AC
  Filled 2013-09-24: qty 16

## 2013-09-24 MED ORDER — PACLITAXEL CHEMO INJECTION 300 MG/50ML
175.0000 mg/m2 | Freq: Once | INTRAVENOUS | Status: AC
  Administered 2013-09-24: 318 mg via INTRAVENOUS
  Filled 2013-09-24: qty 53

## 2013-09-24 MED ORDER — SODIUM CHLORIDE 0.9 % IV SOLN
394.5000 mg | Freq: Once | INTRAVENOUS | Status: AC
  Administered 2013-09-24: 390 mg via INTRAVENOUS
  Filled 2013-09-24: qty 39

## 2013-09-24 MED ORDER — GADOBENATE DIMEGLUMINE 529 MG/ML IV SOLN
15.0000 mL | Freq: Once | INTRAVENOUS | Status: AC | PRN
  Administered 2013-09-24: 13 mL via INTRAVENOUS

## 2013-09-24 NOTE — Patient Instructions (Signed)
[facility] Discharge Instructions for Patients Receiving Chemotherapy  Today you received the following chemotherapy agents Taxol/Carboplatin To help prevent nausea and vomiting after your treatment, we encourage you to take your nausea medication as prescribed.  If you develop nausea and vomiting that is not controlled by your nausea medication, call the clinic.   BELOW ARE SYMPTOMS THAT SHOULD BE REPORTED IMMEDIATELY:  *FEVER GREATER THAN 100.5 F  *CHILLS WITH OR WITHOUT FEVER  NAUSEA AND VOMITING THAT IS NOT CONTROLLED WITH YOUR NAUSEA MEDICATION  *UNUSUAL SHORTNESS OF BREATH  *UNUSUAL BRUISING OR BLEEDING  TENDERNESS IN MOUTH AND THROAT WITH OR WITHOUT PRESENCE OF ULCERS  *URINARY PROBLEMS  *BOWEL PROBLEMS  UNUSUAL RASH Items with * indicate a potential emergency and should be followed up as soon as possible.  Feel free to call the clinic you have any questions or concerns. The clinic phone number is [phone].    

## 2013-09-25 ENCOUNTER — Telehealth: Payer: Self-pay | Admitting: *Deleted

## 2013-09-25 ENCOUNTER — Ambulatory Visit (HOSPITAL_BASED_OUTPATIENT_CLINIC_OR_DEPARTMENT_OTHER): Payer: Medicare Other

## 2013-09-25 VITALS — BP 119/35 | HR 84 | Temp 97.8°F

## 2013-09-25 DIAGNOSIS — Z5189 Encounter for other specified aftercare: Secondary | ICD-10-CM

## 2013-09-25 DIAGNOSIS — C3410 Malignant neoplasm of upper lobe, unspecified bronchus or lung: Secondary | ICD-10-CM

## 2013-09-25 DIAGNOSIS — C3411 Malignant neoplasm of upper lobe, right bronchus or lung: Secondary | ICD-10-CM

## 2013-09-25 MED ORDER — PEGFILGRASTIM INJECTION 6 MG/0.6ML
6.0000 mg | Freq: Once | SUBCUTANEOUS | Status: AC
  Administered 2013-09-25: 6 mg via SUBCUTANEOUS
  Filled 2013-09-25: qty 0.6

## 2013-09-25 NOTE — Patient Instructions (Signed)
Can take Claritin 10mg  once daily for 5-7 days for bone discomfort from the Neulasta.  Use Tylenol or Ibuprofen for pain as needed.  Continue to drink lots of fluids.  Call the office for temperature >100.5.  Pegfilgrastim injection What is this medicine? PEGFILGRASTIM (peg fil GRA stim) helps the body make more white blood cells. It is used to prevent infection in people with low amounts of white blood cells following cancer treatment. This medicine may be used for other purposes; ask your health care provider or pharmacist if you have questions. COMMON BRAND NAME(S): Neulasta What should I tell my health care provider before I take this medicine? They need to know if you have any of these conditions: -sickle cell disease -an unusual or allergic reaction to pegfilgrastim, filgrastim, E.coli protein, other medicines, foods, dyes, or preservatives -pregnant or trying to get pregnant -breast-feeding How should I use this medicine? This medicine is for injection under the skin. It is usually given by a health care professional in a hospital or clinic setting. If you get this medicine at home, you will be taught how to prepare and give this medicine. Do not shake this medicine. Use exactly as directed. Take your medicine at regular intervals. Do not take your medicine more often than directed. It is important that you put your used needles and syringes in a special sharps container. Do not put them in a trash can. If you do not have a sharps container, call your pharmacist or healthcare provider to get one. Talk to your pediatrician regarding the use of this medicine in children. While this drug may be prescribed for children who weigh more than 45 kg for selected conditions, precautions do apply Overdosage: If you think you have taken too much of this medicine contact a poison control center or emergency room at once. NOTE: This medicine is only for you. Do not share this medicine with others. What  if I miss a dose? If you miss a dose, take it as soon as you can. If it is almost time for your next dose, take only that dose. Do not take double or extra doses. What may interact with this medicine? -lithium -medicines for growth therapy This list may not describe all possible interactions. Give your health care provider a list of all the medicines, herbs, non-prescription drugs, or dietary supplements you use. Also tell them if you smoke, drink alcohol, or use illegal drugs. Some items may interact with your medicine. What should I watch for while using this medicine? Visit your doctor for regular check ups. You will need important blood work done while you are taking this medicine. What side effects may I notice from receiving this medicine? Side effects that you should report to your doctor or health care professional as soon as possible: -allergic reactions like skin rash, itching or hives, swelling of the face, lips, or tongue -breathing problems -fever -pain, redness, or swelling where injected -shoulder pain -stomach or side pain Side effects that usually do not require medical attention (report to your doctor or health care professional if they continue or are bothersome): -aches, pains -headache -loss of appetite -nausea, vomiting -unusually tired This list may not describe all possible side effects. Call your doctor for medical advice about side effects. You may report side effects to FDA at 1-800-FDA-1088. Where should I keep my medicine? Keep out of the reach of children. Store in a refrigerator between 2 and 8 degrees C (36 and 46 degrees F). Do  not freeze. Keep in carton to protect from light. Throw away this medicine if it is left out of the refrigerator for more than 48 hours. Throw away any unused medicine after the expiration date. NOTE: This sheet is a summary. It may not cover all possible information. If you have questions about this medicine, talk to your doctor,  pharmacist, or health care provider.  2015, [employer]. (2007-10-08 15:41:44)

## 2013-09-25 NOTE — Telephone Encounter (Signed)
Kenneth Chan here for Neulasta injection following 1st taxol/carboplatin chemo treatment.  States that he is doing well, just a little slow today.  No nausea, vomiting or diarrhea.  Encouraged to drink more fluids.  All questions answered.  Knows to call if he has any problems or concerns.

## 2013-09-28 ENCOUNTER — Emergency Department (HOSPITAL_COMMUNITY)
Admission: EM | Admit: 2013-09-28 | Discharge: 2013-09-28 | Disposition: A | Discharge status: Home / Self Care | Payer: Medicare Other | Attending: Emergency Medicine | Admitting: Emergency Medicine

## 2013-09-28 ENCOUNTER — Encounter (HOSPITAL_COMMUNITY): Payer: Self-pay | Admitting: Emergency Medicine

## 2013-09-28 ENCOUNTER — Emergency Department (HOSPITAL_COMMUNITY): Payer: Medicare Other

## 2013-09-28 DIAGNOSIS — E119 Type 2 diabetes mellitus without complications: Secondary | ICD-10-CM | POA: Insufficient documentation

## 2013-09-28 DIAGNOSIS — Z87891 Personal history of nicotine dependence: Secondary | ICD-10-CM | POA: Insufficient documentation

## 2013-09-28 DIAGNOSIS — Z8719 Personal history of other diseases of the digestive system: Secondary | ICD-10-CM | POA: Insufficient documentation

## 2013-09-28 DIAGNOSIS — Z8781 Personal history of (healed) traumatic fracture: Secondary | ICD-10-CM | POA: Insufficient documentation

## 2013-09-28 DIAGNOSIS — I498 Other specified cardiac arrhythmias: Secondary | ICD-10-CM | POA: Insufficient documentation

## 2013-09-28 DIAGNOSIS — Z79899 Other long term (current) drug therapy: Secondary | ICD-10-CM | POA: Insufficient documentation

## 2013-09-28 DIAGNOSIS — E860 Dehydration: Secondary | ICD-10-CM

## 2013-09-28 DIAGNOSIS — J449 Chronic obstructive pulmonary disease, unspecified: Secondary | ICD-10-CM | POA: Insufficient documentation

## 2013-09-28 DIAGNOSIS — I10 Essential (primary) hypertension: Secondary | ICD-10-CM | POA: Insufficient documentation

## 2013-09-28 DIAGNOSIS — C3411 Malignant neoplasm of upper lobe, right bronchus or lung: Secondary | ICD-10-CM

## 2013-09-28 DIAGNOSIS — C3410 Malignant neoplasm of upper lobe, unspecified bronchus or lung: Secondary | ICD-10-CM | POA: Insufficient documentation

## 2013-09-28 DIAGNOSIS — E785 Hyperlipidemia, unspecified: Secondary | ICD-10-CM | POA: Insufficient documentation

## 2013-09-28 DIAGNOSIS — J029 Acute pharyngitis, unspecified: Secondary | ICD-10-CM | POA: Insufficient documentation

## 2013-09-28 DIAGNOSIS — E039 Hypothyroidism, unspecified: Secondary | ICD-10-CM | POA: Insufficient documentation

## 2013-09-28 DIAGNOSIS — J4489 Other specified chronic obstructive pulmonary disease: Secondary | ICD-10-CM | POA: Insufficient documentation

## 2013-09-28 LAB — BASIC METABOLIC PANEL
ANION GAP: 13 (ref 5–15)
BUN: 31 mg/dL — ABNORMAL HIGH (ref 6–23)
CO2: 26 mEq/L (ref 19–32)
Calcium: 9.3 mg/dL (ref 8.4–10.5)
Chloride: 100 mEq/L (ref 96–112)
Creatinine, Ser: 1.01 mg/dL (ref 0.50–1.35)
GFR calc Af Amer: 79 mL/min — ABNORMAL LOW (ref >90)
GFR, EST NON AFRICAN AMERICAN: 68 mL/min — AB (ref >90)
Glucose, Bld: 155 mg/dL — ABNORMAL HIGH (ref 70–99)
Potassium: 4.7 mEq/L (ref 3.7–5.3)
SODIUM: 139 meq/L (ref 137–147)

## 2013-09-28 LAB — CBC WITH DIFFERENTIAL/PLATELET
Basophils Absolute: 0.3 10*3/uL — ABNORMAL HIGH (ref 0.0–0.1)
Basophils Relative: 2 % — ABNORMAL HIGH (ref 0–1)
EOS ABS: 0.2 10*3/uL (ref 0.0–0.7)
Eosinophils Relative: 1 % (ref 0–5)
HCT: 32.7 % — ABNORMAL LOW (ref 39.0–52.0)
Hemoglobin: 10.4 g/dL — ABNORMAL LOW (ref 13.0–17.0)
Lymphocytes Relative: 2 % — ABNORMAL LOW (ref 12–46)
Lymphs Abs: 0.3 10*3/uL — ABNORMAL LOW (ref 0.7–4.0)
MCH: 28.4 pg (ref 26.0–34.0)
MCHC: 31.8 g/dL (ref 30.0–36.0)
MCV: 89.3 fL (ref 78.0–100.0)
MONO ABS: 0.2 10*3/uL (ref 0.1–1.0)
Monocytes Relative: 1 % — ABNORMAL LOW (ref 3–12)
NEUTROS PCT: 94 % — AB (ref 43–77)
Neutro Abs: 15.2 10*3/uL — ABNORMAL HIGH (ref 1.7–7.7)
PLATELETS: 302 10*3/uL (ref 150–400)
RBC: 3.66 MIL/uL — AB (ref 4.22–5.81)
RDW: 15.1 % (ref 11.5–15.5)
WBC: 16.2 10*3/uL — ABNORMAL HIGH (ref 4.0–10.5)

## 2013-09-28 LAB — RAPID STREP SCREEN (MED CTR MEBANE ONLY): Streptococcus, Group A Screen (Direct): NEGATIVE

## 2013-09-28 MED ORDER — SODIUM CHLORIDE 0.9 % IV BOLUS (SEPSIS)
500.0000 mL | Freq: Once | INTRAVENOUS | Status: AC
  Administered 2013-09-28: 500 mL via INTRAVENOUS

## 2013-09-28 MED ORDER — SODIUM CHLORIDE 0.9 % IV BOLUS (SEPSIS)
1000.0000 mL | Freq: Once | INTRAVENOUS | Status: AC
  Administered 2013-09-28: 1000 mL via INTRAVENOUS

## 2013-09-28 MED ORDER — LIDOCAINE VISCOUS 2 % MT SOLN
20.0000 mL | Freq: Once | OROMUCOSAL | Status: AC
  Administered 2013-09-28: 20 mL via OROMUCOSAL
  Filled 2013-09-28: qty 20

## 2013-09-28 NOTE — ED Notes (Signed)
Patient transported to X-ray 

## 2013-09-28 NOTE — Discharge Instructions (Signed)
For pain control please take Ibuprofen (also known as Motrin or Advil) 400mg  (this is normally 2 over the counter pills) every 6-8 hours. Take with food to minimize stomach irritation.  Push fluids: take small frequent sips of water or Gatorade, do not drink any soda, juice or caffeinated beverages.    Please follow with your primary care doctor in the next 2 days for a check-up. They must obtain records for further management.   Do not hesitate to return to the Emergency Department for any new, worsening or concerning symptoms.   Dehydration, Adult Dehydration is when you lose more fluids from the body than you take in. Vital organs like the kidneys, brain, and heart cannot function without a proper amount of fluids and salt. Any loss of fluids from the body can cause dehydration.  CAUSES   Vomiting.  Diarrhea.  Excessive sweating.  Excessive urine output.  Fever. SYMPTOMS  Mild dehydration  Thirst.  Dry lips.  Slightly dry mouth. Moderate dehydration  Very dry mouth.  Sunken eyes.  Skin does not bounce back quickly when lightly pinched and released.  Dark urine and decreased urine production.  Decreased tear production.  Headache. Severe dehydration  Very dry mouth.  Extreme thirst.  Rapid, weak pulse (more than 100 beats per minute at rest).  Cold hands and feet.  Not able to sweat in spite of heat and temperature.  Rapid breathing.  Blue lips.  Confusion and lethargy.  Difficulty being awakened.  Minimal urine production.  No tears. DIAGNOSIS  Your caregiver will diagnose dehydration based on your symptoms and your exam. Blood and urine tests will help confirm the diagnosis. The diagnostic evaluation should also identify the cause of dehydration. TREATMENT  Treatment of mild or moderate dehydration can often be done at home by increasing the amount of fluids that you drink. It is best to drink small amounts of fluid more often. Drinking too much  at one time can make vomiting worse. Refer to the home care instructions below. Severe dehydration needs to be treated at the hospital where you will probably be given intravenous (IV) fluids that contain water and electrolytes. HOME CARE INSTRUCTIONS   Ask your caregiver about specific rehydration instructions.  Drink enough fluids to keep your urine clear or pale yellow.  Drink small amounts frequently if you have nausea and vomiting.  Eat as you normally do.  Avoid:  Foods or drinks high in sugar.  Carbonated drinks.  Juice.  Extremely hot or cold fluids.  Drinks with caffeine.  Fatty, greasy foods.  Alcohol.  Tobacco.  Overeating.  Gelatin desserts.  Wash your hands well to avoid spreading bacteria and viruses.  Only take over-the-counter or prescription medicines for pain, discomfort, or fever as directed by your caregiver.  Ask your caregiver if you should continue all prescribed and over-the-counter medicines.  Keep all follow-up appointments with your caregiver. SEEK MEDICAL CARE IF:  You have abdominal pain and it increases or stays in one area (localizes).  You have a rash, stiff neck, or severe headache.  You are irritable, sleepy, or difficult to awaken.  You are weak, dizzy, or extremely thirsty. SEEK IMMEDIATE MEDICAL CARE IF:   You are unable to keep fluids down or you get worse despite treatment.  You have frequent episodes of vomiting or diarrhea.  You have blood or green matter (bile) in your vomit.  You have blood in your stool or your stool looks black and tarry.  You have not urinated  in 6 to 8 hours, or you have only urinated a small amount of very dark urine.  You have a fever.  You faint. MAKE SURE YOU:   Understand these instructions.  Will watch your condition.  Will get help right away if you are not doing well or get worse. Document Released: 03/07/2005 Document Revised: 05/30/2011 Document Reviewed:  10/25/2010 [facility] Patient Information 2015 [place], [place]. This information is not intended to replace advice given to you by your health care provider. Make sure you discuss any questions you have with your health care provider.

## 2013-09-28 NOTE — ED Notes (Signed)
Pt w/ hx of lung cancer, on chemo, c/o sore throat since yesterday.  Afebrile.

## 2013-09-28 NOTE — ED Provider Notes (Signed)
CSN: [id number]     Arrival date & time 09/28/13  1630 History   First MD Initiated Contact with Patient 09/28/13 1839     Chief Complaint  Patient presents with  . Cancer  . Sore Throat     (Consider location/radiation/quality/duration/timing/severity/associated sxs/prior Treatment) HPI  Kenneth Chan is a 80 y.o. male with past medical history significant for COPD, non-insulin-dependent diabetes, lung cancer (first chemotherapy was 6 days ago, oncologist is Dr. Inda Merlin) complaining of sore throat onset 2 days ago. Patient denies fever, chills, difficulty swallowing secretions, shortness of breath, nausea, vomiting, chest pain, abdominal pain, change in bowel or bladder habits. Patient is drinking liquids. He is accompanied by his daughter and son-in-law. Neulasta given last week  Past Medical History  Diagnosis Date  . COPD (chronic obstructive pulmonary disease)     on night 02, PFT's 10/17/2008 FEV1 1.31. (62%) ratio 36 -HFA 50% January 31,2011  . DM type 2 (diabetes mellitus, type 2)   . Hyperlipidemia   . Hypothyroidism   . Hypertension     DC ace 02/09/09 (cough) > resolved  . Diverticulosis of colon   . Gallstones   . Lung mass 06-2013  . 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 Shoulder fracture 06-2013  . Lung cancer 09/16/2013   Past Surgical History  Procedure Laterality Date  . Inguinal hernia repair    . Cataract extraction    . Video bronchoscopy Bilateral 09/03/2013    Procedure: VIDEO BRONCHOSCOPY WITHOUT FLUORO;  Surgeon: Tanda Rockers, MD;  Location: [facility];  Service: Cardiopulmonary;  Laterality: Bilateral;   Family History  Problem Relation Age of Onset  . Hypertension Mother   . Coronary artery disease Mother     MI  . Diabetes Neg Hx   . Stroke Neg Hx   . Cancer Neg Hx     colon and prostate  . Prostate cancer Paternal Grandfather     with mets to Bone    History  Substance Use Topics  . Smoking status: Former Smoker -- 1.00 packs/day for 25  years    Types: Cigarettes    Quit date: 03/21/1978  . Smokeless tobacco: Former Systems developer  . Alcohol Use: No    Review of Systems  10 systems reviewed and found to be negative, except as noted in the HPI.   Allergies  Review of patient's allergies indicates no known allergies.  Home Medications   Prior to Admission medications   Medication Sig Start Date End Date Taking? Authorizing Provider  aspirin EC 81 MG tablet Take 81 mg by mouth at bedtime.   Yes Historical Provider, MD  budesonide-formoterol (SYMBICORT) 160-4.5 MCG/ACT inhaler Inhale 2 puffs into the lungs 2 (two) times daily. 2 puffs first thing in am, 2 puffs again in pm about 12 hours later. 08/16/13  Yes Colon Branch, MD  ferrous sulfate 325 (65 FE) MG tablet TAKE 1 TABLET (325 MG TOTAL) BY MOUTH 2 (TWO) TIMES DAILY WITH A MEAL. 09/23/13  Yes Colon Branch, MD  gemfibrozil (LOPID) 600 MG tablet TAKE 1 TABLET TWICE A DAY 07/05/13  Yes Colon Branch, MD  levothyroxine (SYNTHROID, LEVOTHROID) 175 MCG tablet Take 175 mcg by mouth daily before breakfast.   Yes Historical Provider, MD  metFORMIN (GLUCOPHAGE) 500 MG tablet TAKE 1 AND 1/2 TABLETS TWICE A DAY 07/24/13  Yes Colon Branch, MD  multivitamin-iron-minerals-folic acid (CENTRUM) chewable tablet Chew 1 tablet by mouth daily.     Yes Historical Provider, MD  ONE TOUCH ULTRA TEST test strip CHECK BLOOD SUGAR AS DIRECTED. 12/27/11  Yes Colon Branch, MD  valsartan-hydrochlorothiazide (DIOVAN-HCT) 320-25 MG per tablet Take 0.5 tablets by mouth daily.    Yes Historical Provider, MD  verapamil (CALAN) 40 MG tablet Take 40 mg by mouth 3 (three) times daily. 07/05/13  Yes Colon Branch, MD   BP 138/52  Pulse 98  Temp(Src) 98.7 F (37.1 C) (Oral)  Resp 24  SpO2 91% Physical Exam  Nursing note and vitals reviewed. Constitutional: He is oriented to person, place, and time. He appears well-developed and well-nourished. No distress.  HENT:  Head: Normocephalic and atraumatic.  Extremely dry mucous  membranes  No tonsillar hypertrophy or exudate  Conjunctivae is whitened  Eyes: Conjunctivae and EOM are normal. Pupils are equal, round, and reactive to light.  Cardiovascular: Regular rhythm and intact distal pulses.   Mild tachycardia  Pulmonary/Chest: Effort normal and breath sounds normal. No stridor. No respiratory distress. He has no wheezes. He has no rales. He exhibits no tenderness.  Abdominal: Soft. Bowel sounds are normal. He exhibits no distension and no mass. There is no tenderness. There is no rebound.  Musculoskeletal: Normal range of motion.  Lymphadenopathy:    He has no cervical adenopathy.  Neurological: He is alert and oriented to person, place, and time.  Psychiatric: He has a normal mood and affect.    ED Course  Procedures (including critical care time) Labs Review Labs Reviewed  CBC WITH DIFFERENTIAL - Abnormal; Notable for the following:    WBC 16.2 (*)    RBC 3.66 (*)    Hemoglobin 10.4 (*)    HCT 32.7 (*)    Neutrophils Relative % 94 (*)    Lymphocytes Relative 2 (*)    Monocytes Relative 1 (*)    Basophils Relative 2 (*)    Neutro Abs 15.2 (*)    Lymphs Abs 0.3 (*)    Basophils Absolute 0.3 (*)    All other components within normal limits  BASIC METABOLIC PANEL - Abnormal; Notable for the following:    Glucose, Bld 155 (*)    BUN 31 (*)    GFR calc non Af Amer 68 (*)    GFR calc Af Amer 79 (*)    All other components within normal limits  RAPID STREP SCREEN  CULTURE, GROUP A STREP    Imaging Review Dg Neck Soft Tissue  09/28/2013   CLINICAL DATA:  New onset sore throat for 2 days.  EXAM: NECK SOFT TISSUES - 1+ VIEW  COMPARISON:  None.  FINDINGS: There is no evidence of retropharyngeal soft tissue swelling or epiglottic enlargement. The cervical airway is unremarkable and no radio-opaque foreign body identified. Carotid atherosclerosis noted.  IMPRESSION: No acute finding.  Carotid atherosclerosis.   Electronically Signed   By: Inge Rise M.D.   On: 09/28/2013 20:49     EKG Interpretation None      MDM   Final diagnoses:  Dehydration  Acute pharyngitis, unspecified pharyngitis type  Malignant neoplasm of upper lobe of right lung  COPD GOLD II/ noct 02     Filed Vitals:   09/28/13 1651 09/28/13 2006 09/28/13 2055  BP: 120/52 126/68 138/52  Pulse: 110 106 98  Temp: 98.2 F (36.8 C) 98.9 F (37.2 C) 98.7 F (37.1 C)  TempSrc: Oral Oral Oral  Resp: 18 22 24   SpO2: 93% 93% 91%    Medications  sodium chloride 0.9 % bolus 500 mL (0  mLs Intravenous Stopped 09/28/13 2130)  lidocaine (XYLOCAINE) 2 % viscous mouth solution 20 mL (20 mLs Mouth/Throat Given 09/28/13 2032)  sodium chloride 0.9 % bolus 1,000 mL (0 mLs Intravenous Stopped 09/28/13 2130)    Kenneth Chan is a 80 y.o. male presenting with sore throat going on for 2 days. Patient had his first chemotherapy for nonmetastatic lung cancer 5 days ago. Patient is clinically severely dehydrated. Rapid strep is negative. Physical exam is not consistent with a strep pharyngitis.  This is a shared visit with the attending physician who personally evaluated the patient and agrees with the care plan.   Tachycardia resolved with gentle fluid boluses. Pain improved with viscous lidocaine. Soft tissue neck without acute abnormality. Patient amenable to discharge home. Advise NSAIDs for pain control and aggressive hydration. Patient and daughter verbalize understanding and agree with care plan.  Evaluation does not show pathology that would require ongoing emergent intervention or inpatient treatment. Pt is hemodynamically stable and mentating appropriately. Discussed findings and plan with patient/guardian, who agrees with care plan. All questions answered. Return precautions discussed and outpatient follow up given.      Monico Blitz, PA-C 09/29/13 [phone]

## 2013-09-29 NOTE — ED Provider Notes (Signed)
Medical screening examination/treatment/procedure(s) were conducted as a shared visit with non-physician practitioner(s) and myself.  I personally evaluated the patient during the encounter.   EKG Interpretation None      Kenneth Chan is a 80 y.o. male hx of COPD, DM, lung cancer on chemo (last chemo was 6 days ago) here with sore throat and dehydration. No abdominal pain. Was given neulasta with chemo last week. Slightly tachy on arrival, appears dehydrated. MM slightly dry. OP clear with no tonsilar exudate. Neck supple. Cardiac and lung and abdomen exam unremarkable. Labs significant for WBC 16 (down from 23 several days ago). Strep neg, soft tissue neck nl and I doubt RTA. Given hydration and viscous lido and felt better. Has oncology f/u.     Wandra Arthurs, MD 09/29/13 [phone]

## 2013-09-30 LAB — CULTURE, GROUP A STREP

## 2013-10-01 ENCOUNTER — Other Ambulatory Visit (HOSPITAL_BASED_OUTPATIENT_CLINIC_OR_DEPARTMENT_OTHER): Payer: Medicare Other

## 2013-10-01 ENCOUNTER — Ambulatory Visit (HOSPITAL_BASED_OUTPATIENT_CLINIC_OR_DEPARTMENT_OTHER): Payer: Medicare Other | Admitting: Nurse Practitioner

## 2013-10-01 ENCOUNTER — Ambulatory Visit (HOSPITAL_BASED_OUTPATIENT_CLINIC_OR_DEPARTMENT_OTHER): Payer: Medicare Other

## 2013-10-01 VITALS — BP 93/55 | HR 99 | Temp 98.2°F | Resp 20 | Ht 66.5 in | Wt 149.1 lb

## 2013-10-01 DIAGNOSIS — K1231 Oral mucositis (ulcerative) due to antineoplastic therapy: Secondary | ICD-10-CM

## 2013-10-01 DIAGNOSIS — I959 Hypotension, unspecified: Secondary | ICD-10-CM

## 2013-10-01 DIAGNOSIS — R5383 Other fatigue: Secondary | ICD-10-CM

## 2013-10-01 DIAGNOSIS — D61810 Antineoplastic chemotherapy induced pancytopenia: Secondary | ICD-10-CM

## 2013-10-01 DIAGNOSIS — N289 Disorder of kidney and ureter, unspecified: Secondary | ICD-10-CM

## 2013-10-01 DIAGNOSIS — C3411 Malignant neoplasm of upper lobe, right bronchus or lung: Secondary | ICD-10-CM

## 2013-10-01 DIAGNOSIS — C3410 Malignant neoplasm of upper lobe, unspecified bronchus or lung: Secondary | ICD-10-CM

## 2013-10-01 DIAGNOSIS — E46 Unspecified protein-calorie malnutrition: Secondary | ICD-10-CM

## 2013-10-01 DIAGNOSIS — E860 Dehydration: Secondary | ICD-10-CM

## 2013-10-01 DIAGNOSIS — T451X5A Adverse effect of antineoplastic and immunosuppressive drugs, initial encounter: Secondary | ICD-10-CM

## 2013-10-01 DIAGNOSIS — E8809 Other disorders of plasma-protein metabolism, not elsewhere classified: Secondary | ICD-10-CM

## 2013-10-01 DIAGNOSIS — R5381 Other malaise: Secondary | ICD-10-CM

## 2013-10-01 DIAGNOSIS — I9589 Other hypotension: Secondary | ICD-10-CM

## 2013-10-01 DIAGNOSIS — D696 Thrombocytopenia, unspecified: Secondary | ICD-10-CM

## 2013-10-01 LAB — CBC WITH DIFFERENTIAL/PLATELET
BASO%: 0.4 % (ref 0.0–2.0)
Basophils Absolute: 0 10*3/uL (ref 0.0–0.1)
EOS%: 2 % (ref 0.0–7.0)
Eosinophils Absolute: 0 10*3/uL (ref 0.0–0.5)
HCT: 29.8 % — ABNORMAL LOW (ref 38.4–49.9)
HGB: 9.4 g/dL — ABNORMAL LOW (ref 13.0–17.1)
LYMPH#: 0.2 10*3/uL — AB (ref 0.9–3.3)
LYMPH%: 19 % (ref 14.0–49.0)
MCH: 27.9 pg (ref 27.2–33.4)
MCHC: 31.4 g/dL — AB (ref 32.0–36.0)
MCV: 88.7 fL (ref 79.3–98.0)
MONO#: 0.3 10*3/uL (ref 0.1–0.9)
MONO%: 37 % — ABNORMAL HIGH (ref 0.0–14.0)
NEUT#: 0.4 10*3/uL — CL (ref 1.5–6.5)
NEUT%: 41.6 % (ref 39.0–75.0)
Platelets: 206 10*3/uL (ref 140–400)
RBC: 3.36 10*6/uL — AB (ref 4.20–5.82)
RDW: 15.7 % — ABNORMAL HIGH (ref 11.0–14.6)
WBC: 0.9 10*3/uL — CL (ref 4.0–10.3)

## 2013-10-01 LAB — COMPREHENSIVE METABOLIC PANEL (CC13)
ALT: 29 U/L (ref 0–55)
AST: 19 U/L (ref 5–34)
Albumin: 2.1 g/dL — ABNORMAL LOW (ref 3.5–5.0)
Alkaline Phosphatase: 65 U/L (ref 40–150)
Anion Gap: 12 mEq/L — ABNORMAL HIGH (ref 3–11)
BILIRUBIN TOTAL: 0.48 mg/dL (ref 0.20–1.20)
BUN: 44 mg/dL — ABNORMAL HIGH (ref 7.0–26.0)
CHLORIDE: 104 meq/L (ref 98–109)
CO2: 24 mEq/L (ref 22–29)
Calcium: 9.2 mg/dL (ref 8.4–10.4)
Creatinine: 1.6 mg/dL — ABNORMAL HIGH (ref 0.7–1.3)
Glucose: 140 mg/dl (ref 70–140)
Potassium: 4.4 mEq/L (ref 3.5–5.1)
SODIUM: 139 meq/L (ref 136–145)
TOTAL PROTEIN: 6.1 g/dL — AB (ref 6.4–8.3)

## 2013-10-01 MED ORDER — SODIUM CHLORIDE 0.9 % IV SOLN
Freq: Once | INTRAVENOUS | Status: AC
  Administered 2013-10-01: 15:00:00 via INTRAVENOUS

## 2013-10-01 MED ORDER — CIPROFLOXACIN HCL 500 MG PO TABS: 500.0000 mg | ORAL_TABLET | Freq: Two times a day (BID) | ORAL | Status: DC

## 2013-10-01 MED ORDER — MAGIC MOUTHWASH W/LIDOCAINE
5.0000 mL | Freq: Four times a day (QID) | ORAL | Status: AC | PRN
Start: 2013-10-01 — End: ?

## 2013-10-01 MED ORDER — FLUCONAZOLE IN SODIUM CHLORIDE 400-0.9 MG/200ML-% IV SOLN
400.0000 mg | Freq: Once | INTRAVENOUS | Status: AC
  Administered 2013-10-01: 400 mg via INTRAVENOUS
  Filled 2013-10-01: qty 200

## 2013-10-01 NOTE — Patient Instructions (Signed)
Candida Infection, Adult A candida infection (also called yeast, fungus and Monilia infection) is an overgrowth of yeast that can occur anywhere on the body. A yeast infection commonly occurs in warm, moist body areas. Usually, the infection remains localized but can spread to become a systemic infection. A yeast infection may be a sign of a more severe disease such as diabetes, leukemia, or AIDS. A yeast infection can occur in both men and women. In women, Candida vaginitis is a vaginal infection. It is one of the most common causes of vaginitis. Men usually do not have symptoms or know they have an infection until other problems develop. Men may find out they have a yeast infection because their sex partner has a yeast infection. Uncircumcised men are more likely to get a yeast infection than circumcised men. This is because the uncircumcised glans is not exposed to air and does not remain as dry as that of a circumcised glans. Older adults may develop yeast infections around dentures. CAUSES  Women  Antibiotics.  Steroid medication taken for a long time.  Being overweight (obese).  Diabetes.  Poor immune condition.  Certain serious medical conditions.  Immune suppressive medications for organ transplant patients.  Chemotherapy.  Pregnancy.  Menstration.  Stress and fatigue.  Intravenous drug use.  Oral contraceptives.  Wearing tight-fitting clothes in the crotch area.  Catching it from a sex partner who has a yeast infection.  Spermicide.  Intravenous, urinary, or other catheters. Men  Catching it from a sex partner who has a yeast infection.  Having oral or anal sex with a person who has the infection.  Spermicide.  Diabetes.  Antibiotics.  Poor immune system.  Medications that suppress the immune system.  Intravenous drug use.  Intravenous, urinary, or other catheters. SYMPTOMS  Women  Thick, white vaginal discharge.  Vaginal itching.  Redness and  swelling in and around the vagina.  Irritation of the lips of the vagina and perineum.  Blisters on the vaginal lips and perineum.  Painful sexual intercourse.  Low blood sugar (hypoglycemia).  Painful urination.  Bladder infections.  Intestinal problems such as constipation, indigestion, bad breath, bloating, increase in gas, diarrhea, or loose stools. Men  Men may develop intestinal problems such as constipation, indigestion, bad breath, bloating, increase in gas, diarrhea, or loose stools.  Dry, cracked skin on the penis with itching or discomfort.  Jock itch.  Dry, flaky skin.  Athlete's foot.  Hypoglycemia. DIAGNOSIS  Women  A history and an exam are performed.  The discharge may be examined under a microscope.  A culture may be taken of the discharge. Men  A history and an exam are performed.  Any discharge from the penis or areas of cracked skin will be looked at under the microscope and cultured.  Stool samples may be cultured. TREATMENT  Women  Vaginal antifungal suppositories and creams.  Medicated creams to decrease irritation and itching on the outside of the vagina.  Warm compresses to the perineal area to decrease swelling and discomfort.  Oral antifungal medications.  Medicated vaginal suppositories or cream for repeated or recurrent infections.  Wash and dry the irritation areas before applying the cream.  Eating yogurt with lactobacillus may help with prevention and treatment.  Sometimes painting the vagina with gentian violet solution may help if creams and suppositories do not work. Men  Antifungal creams and oral antifungal medications.  Sometimes treatment must continue for 30 days after the symptoms go away to prevent recurrence. HOME CARE  INSTRUCTIONS  Women  Use cotton underwear and avoid tight-fitting clothing.  Avoid colored, scented toilet paper and deodorant tampons or pads.  Do not douche.  Keep your diabetes  under control.  Finish all the prescribed medications.  Keep your skin clean and dry.  Consume milk or yogurt with lactobacillus active culture regularly. If you get frequent yeast infections and think that is what the infection is, there are over-the-counter medications that you can get. If the infection does not show healing in 3 days, talk to your caregiver.  Tell your sex partner you have a yeast infection. Your partner may need treatment also, especially if your infection does not clear up or recurs. Men  Keep your skin clean and dry.  Keep your diabetes under control.  Finish all prescribed medications.  Tell your sex partner that you have a yeast infection so they can be treated if necessary. SEEK MEDICAL CARE IF:   Your symptoms do not clear up or worsen in one week after treatment.  You have an oral temperature above 102 F (38.9 C).  You have trouble swallowing or eating for a prolonged time.  You develop blisters on and around your vagina.  You develop vaginal bleeding and it is not your menstrual period.  You develop abdominal pain.  You develop intestinal problems as mentioned above.  You get weak or lightheaded.  You have painful or increased urination.  You have pain during sexual intercourse. MAKE SURE YOU:   Understand these instructions.  Will watch your condition.  Will get help right away if you are not doing well or get worse. Document Released: 04/14/2004 Document Revised: 05/30/2011 Document Reviewed: 07/27/2009 [facility] Patient Information 2015 [place], [place]. This information is not intended to replace advice given to you by your health care provider. Make sure you discuss any questions you have with your health care provider. Dehydration, Adult Dehydration is when you lose more fluids from the body than you take in. Vital organs like the kidneys, brain, and heart cannot function without a proper amount of fluids and salt. Any loss of fluids  from the body can cause dehydration.  CAUSES   Vomiting.  Diarrhea.  Excessive sweating.  Excessive urine output.  Fever. SYMPTOMS  Mild dehydration  Thirst.  Dry lips.  Slightly dry mouth. Moderate dehydration  Very dry mouth.  Sunken eyes.  Skin does not bounce back quickly when lightly pinched and released.  Dark urine and decreased urine production.  Decreased tear production.  Headache. Severe dehydration  Very dry mouth.  Extreme thirst.  Rapid, weak pulse (more than 100 beats per minute at rest).  Cold hands and feet.  Not able to sweat in spite of heat and temperature.  Rapid breathing.  Blue lips.  Confusion and lethargy.  Difficulty being awakened.  Minimal urine production.  No tears. DIAGNOSIS  Your caregiver will diagnose dehydration based on your symptoms and your exam. Blood and urine tests will help confirm the diagnosis. The diagnostic evaluation should also identify the cause of dehydration. TREATMENT  Treatment of mild or moderate dehydration can often be done at home by increasing the amount of fluids that you drink. It is best to drink small amounts of fluid more often. Drinking too much at one time can make vomiting worse. Refer to the home care instructions below. Severe dehydration needs to be treated at the hospital where you will probably be given intravenous (IV) fluids that contain water and electrolytes. HOME CARE INSTRUCTIONS  Ask your caregiver about specific rehydration instructions.  Drink enough fluids to keep your urine clear or pale yellow.  Drink small amounts frequently if you have nausea and vomiting.  Eat as you normally do.  Avoid:  Foods or drinks high in sugar.  Carbonated drinks.  Juice.  Extremely hot or cold fluids.  Drinks with caffeine.  Fatty, greasy foods.  Alcohol.  Tobacco.  Overeating.  Gelatin desserts.  Wash your hands well to avoid spreading bacteria and  viruses.  Only take over-the-counter or prescription medicines for pain, discomfort, or fever as directed by your caregiver.  Ask your caregiver if you should continue all prescribed and over-the-counter medicines.  Keep all follow-up appointments with your caregiver. SEEK MEDICAL CARE IF:  You have abdominal pain and it increases or stays in one area (localizes).  You have a rash, stiff neck, or severe headache.  You are irritable, sleepy, or difficult to awaken.  You are weak, dizzy, or extremely thirsty. SEEK IMMEDIATE MEDICAL CARE IF:   You are unable to keep fluids down or you get worse despite treatment.  You have frequent episodes of vomiting or diarrhea.  You have blood or green matter (bile) in your vomit.  You have blood in your stool or your stool looks black and tarry.  You have not urinated in 6 to 8 hours, or you have only urinated a small amount of very dark urine.  You have a fever.  You faint. MAKE SURE YOU:   Understand these instructions.  Will watch your condition.  Will get help right away if you are not doing well or get worse. Document Released: 03/07/2005 Document Revised: 05/30/2011 Document Reviewed: 10/25/2010 [facility] Patient Information 2015 [place], [place]. This information is not intended to replace advice given to you by your health care provider. Make sure you discuss any questions you have with your health care provider.

## 2013-10-02 ENCOUNTER — Encounter: Payer: Self-pay | Admitting: Nurse Practitioner

## 2013-10-02 ENCOUNTER — Other Ambulatory Visit: Payer: Self-pay | Admitting: *Deleted

## 2013-10-02 ENCOUNTER — Other Ambulatory Visit: Payer: Self-pay | Admitting: Nurse Practitioner

## 2013-10-02 ENCOUNTER — Ambulatory Visit (HOSPITAL_BASED_OUTPATIENT_CLINIC_OR_DEPARTMENT_OTHER): Payer: Medicare Other

## 2013-10-02 ENCOUNTER — Encounter: Payer: Self-pay | Admitting: Medical Oncology

## 2013-10-02 ENCOUNTER — Other Ambulatory Visit: Payer: Self-pay | Admitting: Medical Oncology

## 2013-10-02 DIAGNOSIS — I959 Hypotension, unspecified: Secondary | ICD-10-CM | POA: Insufficient documentation

## 2013-10-02 DIAGNOSIS — N179 Acute kidney failure, unspecified: Secondary | ICD-10-CM

## 2013-10-02 DIAGNOSIS — T451X5A Adverse effect of antineoplastic and immunosuppressive drugs, initial encounter: Secondary | ICD-10-CM

## 2013-10-02 DIAGNOSIS — R5383 Other fatigue: Secondary | ICD-10-CM | POA: Insufficient documentation

## 2013-10-02 DIAGNOSIS — D61810 Antineoplastic chemotherapy induced pancytopenia: Secondary | ICD-10-CM | POA: Insufficient documentation

## 2013-10-02 DIAGNOSIS — C3410 Malignant neoplasm of upper lobe, unspecified bronchus or lung: Secondary | ICD-10-CM

## 2013-10-02 DIAGNOSIS — N289 Disorder of kidney and ureter, unspecified: Secondary | ICD-10-CM | POA: Insufficient documentation

## 2013-10-02 DIAGNOSIS — E46 Unspecified protein-calorie malnutrition: Secondary | ICD-10-CM | POA: Insufficient documentation

## 2013-10-02 DIAGNOSIS — E860 Dehydration: Secondary | ICD-10-CM

## 2013-10-02 DIAGNOSIS — C3490 Malignant neoplasm of unspecified part of unspecified bronchus or lung: Secondary | ICD-10-CM

## 2013-10-02 DIAGNOSIS — E8809 Other disorders of plasma-protein metabolism, not elsewhere classified: Secondary | ICD-10-CM | POA: Insufficient documentation

## 2013-10-02 MED ORDER — HYDROCODONE-ACETAMINOPHEN 7.5-325 MG/15ML PO SOLN: 5.0000 mL | Freq: Four times a day (QID) | ORAL | Status: AC | PRN

## 2013-10-02 MED ORDER — SODIUM CHLORIDE 0.9 % IV SOLN: Freq: Once | INTRAVENOUS | Status: AC

## 2013-10-02 MED ORDER — SODIUM CHLORIDE 0.9 % IV SOLN
Freq: Once | INTRAVENOUS | Status: AC
  Administered 2013-10-02: 14:00:00 via INTRAVENOUS

## 2013-10-02 NOTE — Assessment & Plan Note (Addendum)
Dehydration secondary to severe mucositis discomfort.  The patient received approximately 1 L of normal saline IV fluid rehydration while in the infusion Center today.

## 2013-10-02 NOTE — Assessment & Plan Note (Signed)
Patient is a new onset of mild renal insufficiency is most likely due to dehydration.  Hopefully this will resolve with IV fluid rehydration.

## 2013-10-02 NOTE — Assessment & Plan Note (Signed)
Fatigue is most likely secondary to chemotherapy; as well as dehydration.  Patient was encouraged to remain as active as possible.

## 2013-10-02 NOTE — Progress Notes (Addendum)
[place]   Chief Complaint  Patient presents with  . Mucositis  . Dehydration    HPI: Kenneth Chan 80 y.o. male recently diagnosed with lung cancer.  Received a first cycle of carboplatinum/paclitaxel chemotherapy with Neulasta support on 09/24/2013.  Presents today with complaint of severe mucositis, fatigue, and dehydration.  He states that he has had minimal oral intake due to his severe mouth pain.  Denies any recent fever or chills.  CURRENT THERAPY: Upcoming Treatment Dates - LUNG Carboplatin / Paclitaxel q21d Dose Reduction Days with orders from any treatment category:  10/15/2013      SCHEDULING COMMUNICATION      diphenhydrAMINE (BENADRYL) injection 50 mg      Dexamethasone Sodium Phosphate (DECADRON) injection 20 mg      ondansetron (ZOFRAN) IVPB 16 mg      famotidine (PEPCID) IVPB 20 mg      PACLitaxel (TAXOL) 318 mg in dextrose 5 % 500 mL chemo infusion (> 80mg /m2)      CARBOplatin (PARAPLATIN) in sodium chloride 0.9 % 100 mL chemo infusion      sodium chloride 0.9 % injection 10 mL      heparin lock flush 100 unit/mL      heparin lock flush 100 unit/mL      alteplase (CATHFLO ACTIVASE) injection 2 mg      sodium chloride 0.9 % injection 3 mL      Cold Pack 1 packet      diphenhydrAMINE (BENADRYL) injection 25 mg      famotidine (PEPCID) IVPB 20 mg      0.9 %  sodium chloride infusion      methylPREDNISolone sodium succinate (SOLU-MEDROL) 125 mg/2 mL injection 125 mg      EPINEPHrine (ADRENALIN) 0.1 MG/ML injection 0.25 mg      EPINEPHrine (ADRENALIN) 0.1 MG/ML injection 0.25 mg      EPINEPHrine (ADRENALIN) injection 0.5 mg      EPINEPHrine (ADRENALIN) injection 0.5 mg      diphenhydrAMINE (BENADRYL) injection 50 mg      albuterol (PROVENTIL) (2.5 MG/3ML) 0.083% nebulizer solution 2.5 mg      0.9 %  sodium chloride infusion      TREATMENT CONDITIONS 10/16/2013      SCHEDULING COMMUNICATION INJECTION      pegfilgrastim (NEULASTA) injection 6  mg 11/05/2013      SCHEDULING COMMUNICATION      diphenhydrAMINE (BENADRYL) injection 50 mg      Dexamethasone Sodium Phosphate (DECADRON) injection 20 mg      ondansetron (ZOFRAN) IVPB 16 mg      famotidine (PEPCID) IVPB 20 mg      PACLitaxel (TAXOL) 318 mg in dextrose 5 % 500 mL chemo infusion (> 80mg /m2)      CARBOplatin (PARAPLATIN) in sodium chloride 0.9 % 100 mL chemo infusion      sodium chloride 0.9 % injection 10 mL      heparin lock flush 100 unit/mL      heparin lock flush 100 unit/mL      alteplase (CATHFLO ACTIVASE) injection 2 mg      sodium chloride 0.9 % injection 3 mL      Cold Pack 1 packet      diphenhydrAMINE (BENADRYL) injection 25 mg      famotidine (PEPCID) IVPB 20 mg      0.9 %  sodium chloride infusion      methylPREDNISolone sodium succinate (SOLU-MEDROL) 125 mg/2 mL injection 125 mg  EPINEPHrine (ADRENALIN) 0.1 MG/ML injection 0.25 mg      EPINEPHrine (ADRENALIN) 0.1 MG/ML injection 0.25 mg      EPINEPHrine (ADRENALIN) injection 0.5 mg      EPINEPHrine (ADRENALIN) injection 0.5 mg      diphenhydrAMINE (BENADRYL) injection 50 mg      albuterol (PROVENTIL) (2.5 MG/3ML) 0.083% nebulizer solution 2.5 mg      0.9 %  sodium chloride infusion      TREATMENT CONDITIONS   Past Medical History  Diagnosis Date  . COPD (chronic obstructive pulmonary disease)     on night 02, PFT's 10/17/2008 FEV1 1.31. (62%) ratio 36 -HFA 50% January 31,2011  . DM type 2 (diabetes mellitus, type 2)   . Hyperlipidemia   . Hypothyroidism   . Hypertension     DC ace 02/09/09 (cough) > resolved  . Diverticulosis of colon   . Gallstones   . Lung mass 06-2013  . 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 Shoulder fracture 06-2013  . Lung cancer 09/16/2013    Past Surgical History  Procedure Laterality Date  . Inguinal hernia repair    . Cataract extraction    . Video bronchoscopy Bilateral 09/03/2013    Procedure: VIDEO BRONCHOSCOPY WITHOUT FLUORO;  Surgeon: Tanda Rockers, MD;   Location: [facility];  Service: Cardiopulmonary;  Laterality: Bilateral;    has HYPOTHYROIDISM NOS; DIABETES MELLITUS, TYPE II; HYPERLIPIDEMIA; HYPERTENSION; COPD GOLD II/ noct 02 ; DIVERTICULOSIS, COLON; HX OF GALLSTONE; Dermatitis; Medicare annual wellness visit, subsequent; Lung mass; Anemia; Multifocal atrial tachycardia; Lung cancer; Dehydration; Mucositis (ulcerative) due to antineoplastic therapy; Fatigue; Hypotension; Pancytopenia due to antineoplastic chemotherapy; Acute renal insufficiency; and Hypoalbuminemia due to protein-calorie malnutrition on his problem list.     has No Known Allergies.    Medication List       This list is accurate as of: 10/01/13 11:59 PM.  Always use your most recent med list.               aspirin EC 81 MG tablet  Take 81 mg by mouth at bedtime.     budesonide-formoterol 160-4.5 MCG/ACT inhaler  Commonly known as:  SYMBICORT  Inhale 2 puffs into the lungs 2 (two) times daily. 2 puffs first thing in am, 2 puffs again in pm about 12 hours later.     ciprofloxacin 500 MG tablet  Commonly known as:  CIPRO  Take 1 tablet (500 mg total) by mouth 2 (two) times daily.     ferrous sulfate 325 (65 FE) MG tablet  TAKE 1 TABLET (325 MG TOTAL) BY MOUTH 2 (TWO) TIMES DAILY WITH A MEAL.     gemfibrozil 600 MG tablet  Commonly known as:  LOPID  TAKE 1 TABLET TWICE A DAY     levothyroxine 175 MCG tablet  Commonly known as:  SYNTHROID, LEVOTHROID  Take 175 mcg by mouth daily before breakfast.     magic mouthwash w/lidocaine Soln  Take 5 mLs by mouth 4 (four) times daily as needed for mouth pain.     metFORMIN 500 MG tablet  Commonly known as:  GLUCOPHAGE  TAKE 1 AND 1/2 TABLETS TWICE A DAY     multivitamin-iron-minerals-folic acid chewable tablet  Chew 1 tablet by mouth daily.     ONE TOUCH ULTRA TEST test strip  Generic drug:  glucose blood  CHECK BLOOD SUGAR AS DIRECTED.     valsartan-hydrochlorothiazide 320-25 MG per tablet  Commonly known  as:  DIOVAN-HCT  Take 0.5 tablets by  mouth daily.     verapamil 40 MG tablet  Commonly known as:  CALAN  Take 40 mg by mouth 3 (three) times daily.          PHYSICAL EXAMINATION  Filed Vitals:   10/01/13 1412  BP: 93/55  Pulse: 99  Temp: 98.2 F (36.8 C)  Resp: 20    GENERAL:alert, no distress, cachectic and ill looking SKIN: skin color, texture, turgor are normal, no rashes or significant lesions HEAD: Normocephalic, No masses, lesions, tenderness or abnormalities EYES: PERRLA, Conjunctiva are pink and non-injected EARS: TM's Normal OROPHARYNX:edentulous, exudates present, marked erythema and thrush  NECK: supple, no adenopathy, no bruits, no JVD LYMPH:  no palpable lymphadenopathy BREAST:not examined LUNGS: negative findings:  normal respiratory rate and rhythm, lungs clear to auscultation HEART: regular rate & rhythm, no murmurs and no gallops ABDOMEN:abdomen soft, non-tender, normal bowel sounds and no masses or organomegaly BACK: No CVA tenderness, Mild to moderate spasm of paralumbar muscles EXTREMITIES:no edema, no clubbing, no cyanosis  NEURO: alert & oriented x 3 with fluent speech  LABORATORY DATA:. CBC  Lab Results  Component Value Date   WBC 0.9* 10/01/2013   RBC 3.36* 10/01/2013   HGB 9.4* 10/01/2013   HCT 29.8* 10/01/2013   PLT 206 10/01/2013   MCV 88.7 10/01/2013   MCH 27.9 10/01/2013   MCHC 31.4* 10/01/2013   RDW 15.7* 10/01/2013   LYMPHSABS 0.2* 10/01/2013   MONOABS 0.3 10/01/2013   EOSABS 0.0 10/01/2013   BASOSABS 0.0 10/01/2013     CMET  Lab Results  Component Value Date   NA 139 10/01/2013   K 4.4 10/01/2013   CL 100 09/28/2013   CO2 24 10/01/2013   GLUCOSE 140 10/01/2013   BUN 44.0* 10/01/2013   CREATININE 1.6* 10/01/2013   CALCIUM 9.2 10/01/2013   PROT 6.1* 10/01/2013   ALBUMIN 2.1* 10/01/2013   AST 19 10/01/2013   ALT 29 10/01/2013   ALKPHOS 65 10/01/2013   BILITOT 0.48 10/01/2013   GFRNONAA 68* 09/28/2013   GFRAA 79* 09/28/2013   RADIOGRAPHIC  STUDIES:None  ASSESSMENT/PLAN:    Lung cancer  Assessment & Plan Received first cycle of carboplatin/paclitaxel with Neulasta support on 09/24/2013.  Patient is experiencing chemotherapy induced pancytopenia with an absolute neutrophil count of 0.4 and a hemoglobin down to 9.4.  Briefly reviewed all neutropenia guidelines both patient and his family again today.  The patient is afebrile today.  Cycle 2 of the same chemotherapy regimen is to 10/15/2013.  I may need to consider further reduction of patient's chemotherapy due to such severe side effects.   Dehydration  Assessment & Plan Dehydration secondary to severe mucositis discomfort.  The patient received approximately 1 L of normal saline IV fluid rehydration while in the infusion Center today.   Mucositis (ulcerative) due to antineoplastic therapy  Assessment & Plan Patient is experiencing fairly severe mucositis after only one cycle of his carboplatin/paclitaxel chemotherapy.  Patient was prescribed Magic mouthwash with nystatin at today.  He was also given Diflucan 400 mg IV while in the infusion Center today.  I will also prescribe hydrocodone elixir for pain control as well.  Patient was encouraged to eat soft foods and liquids as often as possible as well.   Fatigue  Assessment & Plan Fatigue is most likely secondary to chemotherapy; as well as dehydration.  Patient was encouraged to remain as active as possible.   Hypotension  Assessment & Plan Pressure has decreased to 93/55; and this is most likely  due to dehydration issues.   Pancytopenia due to antineoplastic chemotherapy  Assessment & Plan Patient is currently neutropenic with a an absolute neutrophil count of 0.4.  Patient was reminded of neutropenia guidelines today.  Patient received Neulasta for growth factor support on the day following his initial chemotherapy.   Acute renal insufficiency  Assessment & Plan Patient is a new onset of mild renal  insufficiency is most likely due to dehydration.  Hopefully this will resolve with IV fluid rehydration.   Hypoalbuminemia due to protein-calorie malnutrition  Assessment & Plan Patient's albumin continues to trend down.  Patient was encouraged to push fluids as much as possible.    Patient stated understanding of all instructions; and was in agreement with this plan of care. The patient knows to call the clinic with any problems, questions or concerns.   Review/collaboration with Dr. Julien Nordmann regarding all aspects of patient's visit today.   Total time spent with patient was 40 minutes;  with greater than 75 percent of that time spent in face to face counseling regarding review of his symptoms, monitoring in the infusion Center, and coordination of care and follow up.  Disclaimer: This note was dictated with voice recognition software. Similar sounding words can inadvertently be transcribed and may not be corrected upon review.   Drue Second, NP 10/02/2013   ADDENDUM: Hematology/Oncology Attending: I had a face to face encounter with the patient. I recommended his care plan. This is a very pleasant 80 years old white male recently diagnosed with metastatic non-small cell lung cancer and he started the first cycle of his systemic chemotherapy with carboplatin and paclitaxel last week with Neulasta injection. He has rough time with the first cycle of his treatment with significant mucositis, dehydration and poor by mouth intake. We will start the patient on Magic mouthwash with lidocaine. I will arrange for the patient to receive IV hydration on daily basis today and the next 2 days. The patient is also neutropenic and we will start him on Cipro 500 mg by mouth twice a day for the next 5 days. He was also reminded of the neutropenic precautions. We will see him back for followup visit in one week for reevaluation and management any adverse effects. He was advised to call immediately if he  has any concerning symptoms in the interval.  Disclaimer: This note was dictated with voice recognition software. Similar sounding words can inadvertently be transcribed and may not be corrected upon review. Eilleen Kempf., MD 10/05/2013

## 2013-10-02 NOTE — Assessment & Plan Note (Signed)
Patient is currently neutropenic with a an absolute neutrophil count of 0.4.  Patient was reminded of neutropenia guidelines today.  Patient received Neulasta for growth factor support on the day following his initial chemotherapy.

## 2013-10-02 NOTE — Assessment & Plan Note (Signed)
Pressure has decreased to 93/55; and this is most likely due to dehydration issues.

## 2013-10-02 NOTE — Assessment & Plan Note (Signed)
Patient is experiencing fairly severe mucositis after only one cycle of his carboplatin/paclitaxel chemotherapy.  Patient was prescribed Magic mouthwash with nystatin at today.  He was also given Diflucan 400 mg IV while in the infusion Center today.  I will also prescribe hydrocodone elixir for pain control as well.  Patient was encouraged to eat soft foods and liquids as often as possible as well.

## 2013-10-02 NOTE — Assessment & Plan Note (Addendum)
Patient's albumin continues to trend down.  Patient was encouraged to push fluids as much as possible.

## 2013-10-02 NOTE — Patient Instructions (Signed)
Dehydration, Adult Dehydration is when you lose more fluids from the body than you take in. Vital organs like the kidneys, brain, and heart cannot function without a proper amount of fluids and salt. Any loss of fluids from the body can cause dehydration.  CAUSES   Vomiting.  Diarrhea.  Excessive sweating.  Excessive urine output.  Fever. SYMPTOMS  Mild dehydration  Thirst.  Dry lips.  Slightly dry mouth. Moderate dehydration  Very dry mouth.  Sunken eyes.  Skin does not bounce back quickly when lightly pinched and released.  Dark urine and decreased urine production.  Decreased tear production.  Headache. Severe dehydration  Very dry mouth.  Extreme thirst.  Rapid, weak pulse (more than 100 beats per minute at rest).  Cold hands and feet.  Not able to sweat in spite of heat and temperature.  Rapid breathing.  Blue lips.  Confusion and lethargy.  Difficulty being awakened.  Minimal urine production.  No tears. DIAGNOSIS  Your caregiver will diagnose dehydration based on your symptoms and your exam. Blood and urine tests will help confirm the diagnosis. The diagnostic evaluation should also identify the cause of dehydration. TREATMENT  Treatment of mild or moderate dehydration can often be done at home by increasing the amount of fluids that you drink. It is best to drink small amounts of fluid more often. Drinking too much at one time can make vomiting worse. Refer to the home care instructions below. Severe dehydration needs to be treated at the hospital where you will probably be given intravenous (IV) fluids that contain water and electrolytes. HOME CARE INSTRUCTIONS   Ask your caregiver about specific rehydration instructions.  Drink enough fluids to keep your urine clear or pale yellow.  Drink small amounts frequently if you have nausea and vomiting.  Eat as you normally do.  Avoid:  Foods or drinks high in sugar.  Carbonated  drinks.  Juice.  Extremely hot or cold fluids.  Drinks with caffeine.  Fatty, greasy foods.  Alcohol.  Tobacco.  Overeating.  Gelatin desserts.  Wash your hands well to avoid spreading bacteria and viruses.  Only take over-the-counter or prescription medicines for pain, discomfort, or fever as directed by your caregiver.  Ask your caregiver if you should continue all prescribed and over-the-counter medicines.  Keep all follow-up appointments with your caregiver. SEEK MEDICAL CARE IF:  You have abdominal pain and it increases or stays in one area (localizes).  You have a rash, stiff neck, or severe headache.  You are irritable, sleepy, or difficult to awaken.  You are weak, dizzy, or extremely thirsty. SEEK IMMEDIATE MEDICAL CARE IF:   You are unable to keep fluids down or you get worse despite treatment.  You have frequent episodes of vomiting or diarrhea.  You have blood or green matter (bile) in your vomit.  You have blood in your stool or your stool looks black and tarry.  You have not urinated in 6 to 8 hours, or you have only urinated a small amount of very dark urine.  You have a fever.  You faint. MAKE SURE YOU:   Understand these instructions.  Will watch your condition.  Will get help right away if you are not doing well or get worse. Document Released: 03/07/2005 Document Revised: 05/30/2011 Document Reviewed: 10/25/2010 Exit[employer] Patient Information 2015 [employer]. This information is not intended to replace advice given to you by your health care provider. Make sure you discuss any questions you have with your health care   provider.  

## 2013-10-02 NOTE — Assessment & Plan Note (Signed)
Received first cycle of carboplatin/paclitaxel with Neulasta support on 09/24/2013.  Patient is experiencing chemotherapy induced pancytopenia with an absolute neutrophil count of 0.4 and a hemoglobin down to 9.4.  Briefly reviewed all neutropenia guidelines both patient and his family again today.  The patient is afebrile today.  Cycle 2 of the same chemotherapy regimen is to 10/15/2013.  I may need to consider further reduction of patient's chemotherapy due to such severe side effects.

## 2013-10-03 ENCOUNTER — Ambulatory Visit (HOSPITAL_BASED_OUTPATIENT_CLINIC_OR_DEPARTMENT_OTHER): Payer: Medicare Other

## 2013-10-03 ENCOUNTER — Telehealth: Payer: Self-pay | Admitting: *Deleted

## 2013-10-03 ENCOUNTER — Other Ambulatory Visit: Payer: Self-pay | Admitting: Nurse Practitioner

## 2013-10-03 ENCOUNTER — Encounter: Payer: Self-pay | Admitting: *Deleted

## 2013-10-03 VITALS — BP 112/36 | HR 69 | Temp 97.0°F | Resp 19

## 2013-10-03 DIAGNOSIS — C3491 Malignant neoplasm of unspecified part of right bronchus or lung: Secondary | ICD-10-CM

## 2013-10-03 DIAGNOSIS — C3410 Malignant neoplasm of upper lobe, unspecified bronchus or lung: Secondary | ICD-10-CM

## 2013-10-03 DIAGNOSIS — E860 Dehydration: Secondary | ICD-10-CM

## 2013-10-03 MED ORDER — SODIUM CHLORIDE 0.9 % IV SOLN
Freq: Once | INTRAVENOUS | Status: AC
  Administered 2013-10-03: 15:00:00 via INTRAVENOUS

## 2013-10-03 MED ORDER — SODIUM CHLORIDE 0.9 % IV SOLN: 1000.0000 mL | Freq: Once | INTRAVENOUS | Status: AC

## 2013-10-03 NOTE — Telephone Encounter (Signed)
Called [place] for follow up after visit in [facility].  Seen by A.P.P. For c/o mucositis and dehydration on 10-01-2013.  Today reports feeling "worse.  I don't feel good today.  I can't walk to get around the house to get anything done.  I hurt all over."   Asked if he has taken the Hycet pain medicine and admits to taking a dose last night.  Has received the MMW and used this once as well.  Denies fever.  Has picked up the antibiotic Cipro.  Asked that he repeat the pain medicine and reviewed sig. To ensure he knows how to take it.  Wife on the phone as well.  Asked that he take these medicines, one dose is not providing relief and to call if worsens.

## 2013-10-03 NOTE — Patient Instructions (Signed)
Dehydration, Adult Dehydration is when you lose more fluids from the body than you take in. Vital organs like the kidneys, brain, and heart cannot function without a proper amount of fluids and salt. Any loss of fluids from the body can cause dehydration.  CAUSES   Vomiting.  Diarrhea.  Excessive sweating.  Excessive urine output.  Fever. SYMPTOMS  Mild dehydration  Thirst.  Dry lips.  Slightly dry mouth. Moderate dehydration  Very dry mouth.  Sunken eyes.  Skin does not bounce back quickly when lightly pinched and released.  Dark urine and decreased urine production.  Decreased tear production.  Headache. Severe dehydration  Very dry mouth.  Extreme thirst.  Rapid, weak pulse (more than 100 beats per minute at rest).  Cold hands and feet.  Not able to sweat in spite of heat and temperature.  Rapid breathing.  Blue lips.  Confusion and lethargy.  Difficulty being awakened.  Minimal urine production.  No tears. DIAGNOSIS  Your caregiver will diagnose dehydration based on your symptoms and your exam. Blood and urine tests will help confirm the diagnosis. The diagnostic evaluation should also identify the cause of dehydration. TREATMENT  Treatment of mild or moderate dehydration can often be done at home by increasing the amount of fluids that you drink. It is best to drink small amounts of fluid more often. Drinking too much at one time can make vomiting worse. Refer to the home care instructions below. Severe dehydration needs to be treated at the hospital where you will probably be given intravenous (IV) fluids that contain water and electrolytes. HOME CARE INSTRUCTIONS   Ask your caregiver about specific rehydration instructions.  Drink enough fluids to keep your urine clear or pale yellow.  Drink small amounts frequently if you have nausea and vomiting.  Eat as you normally do.  Avoid:  Foods or drinks high in sugar.  Carbonated  drinks.  Juice.  Extremely hot or cold fluids.  Drinks with caffeine.  Fatty, greasy foods.  Alcohol.  Tobacco.  Overeating.  Gelatin desserts.  Wash your hands well to avoid spreading bacteria and viruses.  Only take over-the-counter or prescription medicines for pain, discomfort, or fever as directed by your caregiver.  Ask your caregiver if you should continue all prescribed and over-the-counter medicines.  Keep all follow-up appointments with your caregiver. SEEK MEDICAL CARE IF:  You have abdominal pain and it increases or stays in one area (localizes).  You have a rash, stiff neck, or severe headache.  You are irritable, sleepy, or difficult to awaken.  You are weak, dizzy, or extremely thirsty. SEEK IMMEDIATE MEDICAL CARE IF:   You are unable to keep fluids down or you get worse despite treatment.  You have frequent episodes of vomiting or diarrhea.  You have blood or green matter (bile) in your vomit.  You have blood in your stool or your stool looks black and tarry.  You have not urinated in 6 to 8 hours, or you have only urinated a small amount of very dark urine.  You have a fever.  You faint. MAKE SURE YOU:   Understand these instructions.  Will watch your condition.  Will get help right away if you are not doing well or get worse. Document Released: 03/07/2005 Document Revised: 05/30/2011 Document Reviewed: 10/25/2010 Exit[employer] Patient Information 2015 [employer]. This information is not intended to replace advice given to you by your health care provider. Make sure you discuss any questions you have with your health care   provider.  

## 2013-10-03 NOTE — Telephone Encounter (Signed)
Message copied by Cherylynn Ridges on Thu Oct 03, 2013 11:14 AM ------      Message from: Drue Second R      Created: Wed Oct 02, 2013  3:52 PM       PROVIDER:  [facility] ONLY      Triage: follow up call 24-48 hours please. ------

## 2013-10-03 NOTE — Progress Notes (Signed)
[facility] Psychosocial Distress Screening Clinical Social Work  Clinical Social Work was referred by distress screening protocol while seen in the symptom management clinic.  The patient scored a 9 on the Psychosocial Distress Thermometer which indicates severe distress. Pt had physical concerns that were addressed by medical team in clinic. No psychosocial concerns noted, all physical. No need for Clinical Social Worker to assess for distress and other psychosocial needs at this time.   ONCBCN DISTRESS SCREENING 10/02/2013  Mark the number that describes how much distress you have been experiencing in the past week 9  Physical Problem type Pain;Other (comment);Sleep/insomnia;Getting around;Mouth sores/swallowing  Physician notified of physical symptoms Yes  Referral to clinical social work Yes  Referral to dietition Yes    Clinical Social Worker follow up needed: No.  Loren Racer, LCSW Clinical Social Worker [facility] [place] Wednesday, Thursday and Friday Phone: [phone] Fax: [phone]

## 2013-10-04 ENCOUNTER — Other Ambulatory Visit: Payer: Self-pay | Admitting: *Deleted

## 2013-10-04 DIAGNOSIS — C3410 Malignant neoplasm of upper lobe, unspecified bronchus or lung: Secondary | ICD-10-CM

## 2013-10-04 MED ORDER — SODIUM CHLORIDE 0.9 % IV SOLN: Freq: Once | INTRAVENOUS | Status: DC

## 2013-10-05 ENCOUNTER — Ambulatory Visit (HOSPITAL_BASED_OUTPATIENT_CLINIC_OR_DEPARTMENT_OTHER): Payer: Medicare Other

## 2013-10-05 VITALS — BP 119/46 | HR 66 | Temp 97.3°F | Resp 18

## 2013-10-05 DIAGNOSIS — K1231 Oral mucositis (ulcerative) due to antineoplastic therapy: Secondary | ICD-10-CM

## 2013-10-05 DIAGNOSIS — E860 Dehydration: Secondary | ICD-10-CM

## 2013-10-05 DIAGNOSIS — R5383 Other fatigue: Secondary | ICD-10-CM

## 2013-10-05 DIAGNOSIS — R5381 Other malaise: Secondary | ICD-10-CM

## 2013-10-05 MED ORDER — SODIUM CHLORIDE 0.9 % IV SOLN
INTRAVENOUS | Status: AC
  Administered 2013-10-05: 10:00:00 via INTRAVENOUS

## 2013-10-05 NOTE — Addendum Note (Signed)
Addended by: Curt Bears on: 10/05/2013 04:53 PM   Modules accepted: Level of Service

## 2013-10-05 NOTE — Progress Notes (Signed)
Pt arrived to infusion room today with son via wheelchair. Reports malaise, decreased PO intake due to mucositis. Called MD on call for infusion orders: V.O received 1L Normal saline over 2 hours, per Dr. Marin Olp.

## 2013-10-07 ENCOUNTER — Telehealth: Payer: Self-pay | Admitting: Internal Medicine

## 2013-10-07 ENCOUNTER — Other Ambulatory Visit: Payer: Self-pay | Admitting: *Deleted

## 2013-10-07 ENCOUNTER — Other Ambulatory Visit: Payer: Self-pay | Admitting: Medical Oncology

## 2013-10-07 ENCOUNTER — Telehealth: Payer: Self-pay | Admitting: Medical Oncology

## 2013-10-07 DIAGNOSIS — C3410 Malignant neoplasm of upper lobe, unspecified bronchus or lung: Secondary | ICD-10-CM

## 2013-10-07 DIAGNOSIS — R5383 Other fatigue: Secondary | ICD-10-CM

## 2013-10-07 NOTE — Telephone Encounter (Signed)
Pt asking if he should take the "KMO"-meaning chemo . He said the last treatment was "rough".  He denies fever, nausea, vomiting or other symptoms " just weak". I told him his next appt is tomorrow for labs and I will add him on to see NP.

## 2013-10-07 NOTE — Progress Notes (Signed)
Review prior to appt, stat labs and fluids for patient tomorrow

## 2013-10-07 NOTE — Progress Notes (Signed)
I offered appt today for labs and pt stated he wanted to wait until tomorrow.

## 2013-10-07 NOTE — Telephone Encounter (Signed)
appt w/CB per 7/20 pof already on schedule - added by desk nurse. per 2nd 7/20 pof pt also needs fluids after visit. messag to inf scheduler.

## 2013-10-08 ENCOUNTER — Telehealth: Payer: Self-pay | Admitting: *Deleted

## 2013-10-08 ENCOUNTER — Encounter: Payer: Self-pay | Admitting: Nurse Practitioner

## 2013-10-08 ENCOUNTER — Other Ambulatory Visit (HOSPITAL_BASED_OUTPATIENT_CLINIC_OR_DEPARTMENT_OTHER): Payer: Medicare Other

## 2013-10-08 ENCOUNTER — Ambulatory Visit (HOSPITAL_BASED_OUTPATIENT_CLINIC_OR_DEPARTMENT_OTHER): Payer: Medicare Other | Admitting: Nurse Practitioner

## 2013-10-08 ENCOUNTER — Telehealth: Payer: Self-pay | Admitting: Nurse Practitioner

## 2013-10-08 ENCOUNTER — Ambulatory Visit (HOSPITAL_BASED_OUTPATIENT_CLINIC_OR_DEPARTMENT_OTHER): Payer: Medicare Other

## 2013-10-08 VITALS — BP 104/40 | HR 79 | Temp 98.9°F | Resp 18 | Ht 66.5 in | Wt 154.2 lb

## 2013-10-08 DIAGNOSIS — C3410 Malignant neoplasm of upper lobe, unspecified bronchus or lung: Secondary | ICD-10-CM

## 2013-10-08 DIAGNOSIS — E860 Dehydration: Secondary | ICD-10-CM

## 2013-10-08 DIAGNOSIS — E8809 Other disorders of plasma-protein metabolism, not elsewhere classified: Secondary | ICD-10-CM

## 2013-10-08 DIAGNOSIS — R5383 Other fatigue: Secondary | ICD-10-CM

## 2013-10-08 DIAGNOSIS — R5381 Other malaise: Secondary | ICD-10-CM

## 2013-10-08 DIAGNOSIS — N289 Disorder of kidney and ureter, unspecified: Secondary | ICD-10-CM

## 2013-10-08 DIAGNOSIS — T451X5A Adverse effect of antineoplastic and immunosuppressive drugs, initial encounter: Secondary | ICD-10-CM

## 2013-10-08 DIAGNOSIS — E46 Unspecified protein-calorie malnutrition: Secondary | ICD-10-CM

## 2013-10-08 DIAGNOSIS — C3491 Malignant neoplasm of unspecified part of right bronchus or lung: Secondary | ICD-10-CM

## 2013-10-08 DIAGNOSIS — D61810 Antineoplastic chemotherapy induced pancytopenia: Secondary | ICD-10-CM

## 2013-10-08 DIAGNOSIS — R531 Weakness: Secondary | ICD-10-CM

## 2013-10-08 DIAGNOSIS — K1231 Oral mucositis (ulcerative) due to antineoplastic therapy: Secondary | ICD-10-CM

## 2013-10-08 DIAGNOSIS — R630 Anorexia: Secondary | ICD-10-CM

## 2013-10-08 LAB — CBC WITH DIFFERENTIAL/PLATELET
BASO%: 0.4 % (ref 0.0–2.0)
Basophils Absolute: 0.1 10*3/uL (ref 0.0–0.1)
EOS%: 0 % (ref 0.0–7.0)
Eosinophils Absolute: 0 10*3/uL (ref 0.0–0.5)
HEMATOCRIT: 31.7 % — AB (ref 38.4–49.9)
HGB: 9.9 g/dL — ABNORMAL LOW (ref 13.0–17.1)
LYMPH#: 1 10*3/uL (ref 0.9–3.3)
LYMPH%: 4.1 % — ABNORMAL LOW (ref 14.0–49.0)
MCH: 27.7 pg (ref 27.2–33.4)
MCHC: 31.2 g/dL — ABNORMAL LOW (ref 32.0–36.0)
MCV: 88.5 fL (ref 79.3–98.0)
MONO#: 0.9 10*3/uL (ref 0.1–0.9)
MONO%: 3.8 % (ref 0.0–14.0)
NEUT#: 22.2 10*3/uL — ABNORMAL HIGH (ref 1.5–6.5)
NEUT%: 91.7 % — AB (ref 39.0–75.0)
Platelets: 258 10*3/uL (ref 140–400)
RBC: 3.58 10*6/uL — ABNORMAL LOW (ref 4.20–5.82)
RDW: 15.5 % — ABNORMAL HIGH (ref 11.0–14.6)
WBC: 24.2 10*3/uL — ABNORMAL HIGH (ref 4.0–10.3)

## 2013-10-08 LAB — COMPREHENSIVE METABOLIC PANEL (CC13)
ALT: 25 U/L (ref 0–55)
AST: 23 U/L (ref 5–34)
Albumin: 1.9 g/dL — ABNORMAL LOW (ref 3.5–5.0)
Alkaline Phosphatase: 80 U/L (ref 40–150)
Anion Gap: 11 mEq/L (ref 3–11)
BILIRUBIN TOTAL: 0.24 mg/dL (ref 0.20–1.20)
BUN: 12 mg/dL (ref 7.0–26.0)
CO2: 26 mEq/L (ref 22–29)
CREATININE: 1.6 mg/dL — AB (ref 0.7–1.3)
Calcium: 8 mg/dL — ABNORMAL LOW (ref 8.4–10.4)
Chloride: 101 mEq/L (ref 98–109)
Glucose: 128 mg/dl (ref 70–140)
Potassium: 3.6 mEq/L (ref 3.5–5.1)
Sodium: 138 mEq/L (ref 136–145)
Total Protein: 5.8 g/dL — ABNORMAL LOW (ref 6.4–8.3)

## 2013-10-08 MED ORDER — SODIUM CHLORIDE 0.9 % IV SOLN
INTRAVENOUS | Status: DC
  Administered 2013-10-08: 10:00:00 via INTRAVENOUS

## 2013-10-08 NOTE — Assessment & Plan Note (Signed)
It does appear that patient's severe mucositis is slowly improving.  Patient continues to use the Magic mouthwash with lidocaine approximate 4 times per day.  He is also using saltwater rinses frequently.

## 2013-10-08 NOTE — Patient Instructions (Signed)
Dehydration, Adult Dehydration is when you lose more fluids from the body than you take in. Vital organs like the kidneys, brain, and heart cannot function without a proper amount of fluids and salt. Any loss of fluids from the body can cause dehydration.  CAUSES   Vomiting.  Diarrhea.  Excessive sweating.  Excessive urine output.  Fever. SYMPTOMS  Mild dehydration  Thirst.  Dry lips.  Slightly dry mouth. Moderate dehydration  Very dry mouth.  Sunken eyes.  Skin does not bounce back quickly when lightly pinched and released.  Dark urine and decreased urine production.  Decreased tear production.  Headache. Severe dehydration  Very dry mouth.  Extreme thirst.  Rapid, weak pulse (more than 100 beats per minute at rest).  Cold hands and feet.  Not able to sweat in spite of heat and temperature.  Rapid breathing.  Blue lips.  Confusion and lethargy.  Difficulty being awakened.  Minimal urine production.  No tears. DIAGNOSIS  Your caregiver will diagnose dehydration based on your symptoms and your exam. Blood and urine tests will help confirm the diagnosis. The diagnostic evaluation should also identify the cause of dehydration. TREATMENT  Treatment of mild or moderate dehydration can often be done at home by increasing the amount of fluids that you drink. It is best to drink small amounts of fluid more often. Drinking too much at one time can make vomiting worse. Refer to the home care instructions below. Severe dehydration needs to be treated at the hospital where you will probably be given intravenous (IV) fluids that contain water and electrolytes. HOME CARE INSTRUCTIONS   Ask your caregiver about specific rehydration instructions.  Drink enough fluids to keep your urine clear or pale yellow.  Drink small amounts frequently if you have nausea and vomiting.  Eat as you normally do.  Avoid:  Foods or drinks high in sugar.  Carbonated  drinks.  Juice.  Extremely hot or cold fluids.  Drinks with caffeine.  Fatty, greasy foods.  Alcohol.  Tobacco.  Overeating.  Gelatin desserts.  Wash your hands well to avoid spreading bacteria and viruses.  Only take over-the-counter or prescription medicines for pain, discomfort, or fever as directed by your caregiver.  Ask your caregiver if you should continue all prescribed and over-the-counter medicines.  Keep all follow-up appointments with your caregiver. SEEK MEDICAL CARE IF:  You have abdominal pain and it increases or stays in one area (localizes).  You have a rash, stiff neck, or severe headache.  You are irritable, sleepy, or difficult to awaken.  You are weak, dizzy, or extremely thirsty. SEEK IMMEDIATE MEDICAL CARE IF:   You are unable to keep fluids down or you get worse despite treatment.  You have frequent episodes of vomiting or diarrhea.  You have blood or green matter (bile) in your vomit.  You have blood in your stool or your stool looks black and tarry.  You have not urinated in 6 to 8 hours, or you have only urinated a small amount of very dark urine.  You have a fever.  You faint. MAKE SURE YOU:   Understand these instructions.  Will watch your condition.  Will get help right away if you are not doing well or get worse. Document Released: 03/07/2005 Document Revised: 05/30/2011 Document Reviewed: 10/25/2010 Exit[employer] Patient Information 2015 [employer]. This information is not intended to replace advice given to you by your health care provider. Make sure you discuss any questions you have with your health care   provider.  

## 2013-10-08 NOTE — Telephone Encounter (Signed)
s.wTelford Nab to add ivf

## 2013-10-08 NOTE — Assessment & Plan Note (Signed)
Patient's ANC has increased from 0.4 back up to 22.2.  He last received Neulasta on 09/25/2013.  He continues to deny any recent fever or chills.  His hemoglobin remained stable at 9.9.

## 2013-10-08 NOTE — Assessment & Plan Note (Signed)
Patient's creatinine remains at elevated levels of 1.6.  This may very well be due to dehydration secondary to mucositis.  May want to consider rechecking a creatinine level later this week when patient comes in for additional IV fluid hydration.

## 2013-10-08 NOTE — Assessment & Plan Note (Addendum)
Patient has been experiencing minimal appetite; as well as poor oral intake due to severe mucositis issues.  Does appear that his mouth issues are slowly resolving; so hopefully he will be able to regain his appetite.

## 2013-10-08 NOTE — Assessment & Plan Note (Signed)
Physical deconditioning secondary to disease progression and chemotherapy side effects.  Patient was encouraged to remain as active as possible.  May want to consider a physical therapy referral once patient becomes slightly stronger.

## 2013-10-08 NOTE — Assessment & Plan Note (Signed)
Patient received his first cycle of carboplatin/paclitaxel chemotherapy on 09/24/2013; and received Neulasta growth factor support on 09/25/2013.  Patient is scheduled to receive cycle 2 of the same chemotherapy regimen on 10/15/2013.

## 2013-10-08 NOTE — Progress Notes (Signed)
[place]   Chief Complaint  Patient presents with  . Dehydration  . Mucositis    HPI: Kenneth Chan 80 y.o. male diagnosed with lung cancer.  Patient received his initial carboplatin/paclitaxel chemotherapy on 09/24/2013.  He received Neulasta for growth factor support on 09/25/2013.  Kenneth Chan to experience a severe mucositis with secondary dehydration following his chemotherapy.  He was seen multiple times in the infusion Center last week for IV fluid rehydration.  He has been using the Magic mouthwash with lidocaine; as well as salt water rinses as directed.  Does appear that his mucositis is very slowly resolving.  He continues to complain of fairly significant fatigue/weakness/deconditioning.  He continues with very poor appetite as well.  He denies any recent fever or chills.   CURRENT THERAPY: Upcoming Treatment Dates - LUNG Carboplatin / Paclitaxel q21d Dose Reduction Days with orders from any treatment category:  10/15/2013      SCHEDULING COMMUNICATION      diphenhydrAMINE (BENADRYL) injection 50 mg      Dexamethasone Sodium Phosphate (DECADRON) injection 20 mg      ondansetron (ZOFRAN) IVPB 16 mg      famotidine (PEPCID) IVPB 20 mg      PACLitaxel (TAXOL) 318 mg in dextrose 5 % 500 mL chemo infusion (> 80mg /m2)      CARBOplatin (PARAPLATIN) in sodium chloride 0.9 % 100 mL chemo infusion      sodium chloride 0.9 % injection 10 mL      heparin lock flush 100 unit/mL      heparin lock flush 100 unit/mL      alteplase (CATHFLO ACTIVASE) injection 2 mg      sodium chloride 0.9 % injection 3 mL      Cold Pack 1 packet      diphenhydrAMINE (BENADRYL) injection 25 mg      famotidine (PEPCID) IVPB 20 mg      0.9 %  sodium chloride infusion      methylPREDNISolone sodium succinate (SOLU-MEDROL) 125 mg/2 mL injection 125 mg      EPINEPHrine (ADRENALIN) 0.1 MG/ML injection 0.25 mg      EPINEPHrine (ADRENALIN) 0.1 MG/ML injection 0.25 mg      EPINEPHrine  (ADRENALIN) injection 0.5 mg      EPINEPHrine (ADRENALIN) injection 0.5 mg      diphenhydrAMINE (BENADRYL) injection 50 mg      albuterol (PROVENTIL) (2.5 MG/3ML) 0.083% nebulizer solution 2.5 mg      0.9 %  sodium chloride infusion      TREATMENT CONDITIONS 10/16/2013      SCHEDULING COMMUNICATION INJECTION      pegfilgrastim (NEULASTA) injection 6 mg 11/05/2013      SCHEDULING COMMUNICATION      diphenhydrAMINE (BENADRYL) injection 50 mg      Dexamethasone Sodium Phosphate (DECADRON) injection 20 mg      ondansetron (ZOFRAN) IVPB 16 mg      famotidine (PEPCID) IVPB 20 mg      PACLitaxel (TAXOL) 318 mg in dextrose 5 % 500 mL chemo infusion (> 80mg /m2)      CARBOplatin (PARAPLATIN) in sodium chloride 0.9 % 100 mL chemo infusion      sodium chloride 0.9 % injection 10 mL      heparin lock flush 100 unit/mL      heparin lock flush 100 unit/mL      alteplase (CATHFLO ACTIVASE) injection 2 mg      sodium chloride 0.9 % injection 3 mL  Cold Pack 1 packet      diphenhydrAMINE (BENADRYL) injection 25 mg      famotidine (PEPCID) IVPB 20 mg      0.9 %  sodium chloride infusion      methylPREDNISolone sodium succinate (SOLU-MEDROL) 125 mg/2 mL injection 125 mg      EPINEPHrine (ADRENALIN) 0.1 MG/ML injection 0.25 mg      EPINEPHrine (ADRENALIN) 0.1 MG/ML injection 0.25 mg      EPINEPHrine (ADRENALIN) injection 0.5 mg      EPINEPHrine (ADRENALIN) injection 0.5 mg      diphenhydrAMINE (BENADRYL) injection 50 mg      albuterol (PROVENTIL) (2.5 MG/3ML) 0.083% nebulizer solution 2.5 mg      0.9 %  sodium chloride infusion      TREATMENT CONDITIONS  Past Medical History  Diagnosis Date  . COPD (chronic obstructive pulmonary disease)     on night 02, PFT's 10/17/2008 FEV1 1.31. (62%) ratio 36 -HFA 50% January 31,2011  . DM type 2 (diabetes mellitus, type 2)   . Hyperlipidemia   . Hypothyroidism   . Hypertension     DC ace 02/09/09 (cough) > resolved  . Diverticulosis of colon   .  Gallstones   . Lung mass 06-2013  . 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 Shoulder fracture 06-2013  . Lung cancer 09/16/2013    Past Surgical History  Procedure Laterality Date  . Inguinal hernia repair    . Cataract extraction    . Video bronchoscopy Bilateral 09/03/2013    Procedure: VIDEO BRONCHOSCOPY WITHOUT FLUORO;  Surgeon: Tanda Rockers, MD;  Location: [facility];  Service: Cardiopulmonary;  Laterality: Bilateral;    has HYPOTHYROIDISM NOS; DIABETES MELLITUS, TYPE II; HYPERLIPIDEMIA; HYPERTENSION; COPD GOLD II/ noct 02 ; DIVERTICULOSIS, COLON; HX OF GALLSTONE; Dermatitis; Medicare annual wellness visit, subsequent; Lung mass; Anemia; Multifocal atrial tachycardia; Lung cancer; Dehydration; Mucositis (ulcerative) due to antineoplastic therapy; Fatigue; Hypotension; Pancytopenia due to antineoplastic chemotherapy; Acute renal insufficiency; Hypoalbuminemia due to protein-calorie malnutrition; Physical deconditioning; Weakness; and Anorexia on his problem list.     has No Known Allergies.    Medication List       This list is accurate as of: 10/08/13  5:38 PM.  Always use your most recent med list.               aspirin EC 81 MG tablet  Take 81 mg by mouth at bedtime.     budesonide-formoterol 160-4.5 MCG/ACT inhaler  Commonly known as:  SYMBICORT  Inhale 2 puffs into the lungs 2 (two) times daily. 2 puffs first thing in am, 2 puffs again in pm about 12 hours later.     ciprofloxacin 500 MG tablet  Commonly known as:  CIPRO  Take 1 tablet (500 mg total) by mouth 2 (two) times daily.     ferrous sulfate 325 (65 FE) MG tablet  TAKE 1 TABLET (325 MG TOTAL) BY MOUTH 2 (TWO) TIMES DAILY WITH A MEAL.     gemfibrozil 600 MG tablet  Commonly known as:  LOPID  TAKE 1 TABLET TWICE A DAY     HYDROcodone-acetaminophen 7.5-325 mg/15 ml solution  Commonly known as:  HYCET  Take 5 mLs by mouth 4 (four) times daily as needed for moderate pain.     levothyroxine 175 MCG tablet    Commonly known as:  SYNTHROID, LEVOTHROID  Take 175 mcg by mouth daily before breakfast.     magic mouthwash w/lidocaine Soln  Take 5 mLs by  mouth 4 (four) times daily as needed for mouth pain.     metFORMIN 500 MG tablet  Commonly known as:  GLUCOPHAGE  TAKE 1 AND 1/2 TABLETS TWICE A DAY     multivitamin-iron-minerals-folic acid chewable tablet  Chew 1 tablet by mouth daily.     ONE TOUCH ULTRA TEST test strip  Generic drug:  glucose blood  CHECK BLOOD SUGAR AS DIRECTED.     prochlorperazine 10 MG tablet  Commonly known as:  COMPAZINE     valsartan-hydrochlorothiazide 320-25 MG per tablet  Commonly known as:  DIOVAN-HCT  Take 0.5 tablets by mouth daily.     verapamil 40 MG tablet  Commonly known as:  CALAN  Take 40 mg by mouth 3 (three) times daily.          PHYSICAL EXAMINATION  Blood pressure 104/40, pulse 79, temperature 98.9 F (37.2 C), temperature source Oral, resp. rate 18, height 5' 6.5" (1.689 m), weight 154 lb 3.2 oz (69.945 kg), SpO2 95.00%.  GENERAL:alert, no distress, cachectic and ill looking SKIN: skin color, texture, turgor are normal, no rashes or significant lesions HEAD: Normocephalic, No masses, lesions, tenderness or abnormalities EYES: PERRLA, Conjunctiva are pink and non-injected OROPHARYNX:exudates present, moderate erythema and it does appear that patient's severe mucositis is slowly resolving.  Patient continues with the lesion to the tip of his tongue; as well and is dried, scabbed lesions to both upper and lower lips. NECK: supple, no adenopathy, no bruits, no JVD LYMPH:  no palpable lymphadenopathy BREAST:not examined LUNGS: negative findings:  normal respiratory rate and rhythm, lungs clear to auscultation HEART: regular rate & rhythm, no murmurs and no gallops ABDOMEN:abdomen soft, non-tender, normal bowel sounds and no masses or organomegaly BACK: No CVA tenderness EXTREMITIES:no edema, no clubbing, no cyanosis  NEURO: alert &  oriented x 3 with fluent speech  LABORATORY DATA:. CBC  Lab Results  Component Value Date   WBC 24.2* 10/08/2013   RBC 3.58* 10/08/2013   HGB 9.9* 10/08/2013   HCT 31.7* 10/08/2013   PLT 258 10/08/2013   MCV 88.5 10/08/2013   MCH 27.7 10/08/2013   MCHC 31.2* 10/08/2013   RDW 15.5* 10/08/2013   LYMPHSABS 1.0 10/08/2013   MONOABS 0.9 10/08/2013   EOSABS 0.0 10/08/2013   BASOSABS 0.1 10/08/2013     CMET  Lab Results  Component Value Date   NA 138 10/08/2013   K 3.6 10/08/2013   CL 100 09/28/2013   CO2 26 10/08/2013   GLUCOSE 128 10/08/2013   BUN 12.0 10/08/2013   CREATININE 1.6* 10/08/2013   CALCIUM 8.0* 10/08/2013   PROT 5.8* 10/08/2013   ALBUMIN 1.9* 10/08/2013   AST 23 10/08/2013   ALT 25 10/08/2013   ALKPHOS 80 10/08/2013   BILITOT 0.24 10/08/2013   GFRNONAA 68* 09/28/2013   GFRAA 79* 09/28/2013   Corrected calcium was 8.76.  ASSESSMENT/PLAN:    Lung cancer  Assessment & Plan Patient received his first cycle of carboplatin/paclitaxel chemotherapy on 09/24/2013; and received Neulasta growth factor support on 09/25/2013.  Patient is scheduled to receive cycle 2 of the same chemotherapy regimen on 10/15/2013.   Dehydration  Assessment & Plan Patient received 1 L normal saline IV fluid rehydration today.  Patient may very well need additional IV fluid hydration later this week.  Patient states that he would prefer to wait till Thursday, 10/10/2013 for any additional IV fluid hydration.  Advised Kenneth Chan that we will call and check in on him either Wednesday afternoon or  first thing Thursday morning to confirm appointment for further IV fluid rehydration.  Patient states that he has been trying to drink some protein drinks and some clear soup; this actually only able to take in approximately 3-400 mL's of fluid per day due to his severe mucositis.   Mucositis (ulcerative) due to antineoplastic therapy  Assessment & Plan It does appear that patient's severe mucositis is slowly improving.   Patient continues to use the Magic mouthwash with lidocaine approximate 4 times per day.  He is also using saltwater rinses frequently.   Fatigue  Assessment & Plan At this is of most likely due to both of disease progression; as well as a chemotherapy side effect.  Patient was encouraged to remain as active as possible.   Pancytopenia due to antineoplastic chemotherapy  Assessment & Plan Patient's ANC has increased from 0.4 back up to 22.2.  He last received Neulasta on 09/25/2013.  He continues to deny any recent fever or chills.  His hemoglobin remained stable at 9.9.   Acute renal insufficiency  Assessment & Plan Patient's creatinine remains at elevated levels of 1.6.  This may very well be due to dehydration secondary to mucositis.  May want to consider rechecking a creatinine level later this week when patient comes in for additional IV fluid hydration.   Hypoalbuminemia due to protein-calorie malnutrition  Assessment & Plan The patient's albumin level continues to decrease.  This is most likely due to poor nutritional intake secondary to severe mucositis issues.  Did spend a good deal of time discussing need for increase protein in his diet.  I advised the patient to try either boost or Ensure clear protein drinks.   Physical deconditioning  Assessment & Plan Physical deconditioning secondary to disease progression and chemotherapy side effects.  Patient was encouraged to remain as active as possible.  May want to consider a physical therapy referral once patient becomes slightly stronger.   Weakness  Assessment & Plan Weakness secondary to disease progression, chemotherapy side effects, and poor nutrition due to severe mucositis issues.  See previous note regarding physical deconditioning.   Anorexia  Assessment & Plan Patient has been experiencing minimal appetite; as well as poor oral intake due to severe mucositis issues.  Does appear that his mouth issues are  slowly resolving; so hopefully he will be able to regain his appetite.   Patient stated understanding of all instructions; and was in agreement with this plan of care. The patient knows to call the clinic with any problems, questions or concerns.   Review/collaboration with Dr. Julien Nordmann regarding all aspects of patient's visit today.   This was a shared visit with Dr. Julien Nordmann today.  Total time spent with patient was 25 minutes;  with greater than 75 percent of that time spent in face to face counseling regarding patient's symptoms, continued treatment of mucositis, nutrition/protein options;  and coordination of care and follow up.  Disclaimer: This note was dictated with voice recognition software. Similar sounding words can inadvertently be transcribed and may not be corrected upon review.   Drue Second, NP 10/08/2013   ADDENDUM:  Hematology/Oncology attending:  I had a face to face encounter with the patient. I recommended his care plan. This is a very pleasant 80 years old white male recently diagnosed with stage IV non-small cell lung cancer currently undergoing systemic chemotherapy with carboplatin and paclitaxel is status post 1 cycle. His treatment was complicated with severe mucositis and dehydration. The patient had required IV hydration almost  daily for the last week and he is here today feeling a little bit better but continues to have mild mucositis which is healing well. I recommended for him to continue on the Magic mouthwash and salt water rinse.  Will arrange for the patient to received 1 L of normal saline today. He was also neutropenic and he has significant improvement in his white blood count on today's lab. Will continue to monitor the patient closely and I would consider reducing his dose of chemotherapy with the next cycle. He would come back for followup visit next week for evaluation before starting cycle #2. He was advised to call immediately if he has any  concerning symptoms in the interval.  Disclaimer: This note was dictated with voice recognition software. Similar sounding words can inadvertently be transcribed and may not be corrected upon review. Eilleen Kempf., MD 10/09/2013

## 2013-10-08 NOTE — Assessment & Plan Note (Signed)
The patient's albumin level continues to decrease.  This is most likely due to poor nutritional intake secondary to severe mucositis issues.  Did spend a good deal of time discussing need for increase protein in his diet.  I advised the patient to try either boost or Ensure clear protein drinks.

## 2013-10-08 NOTE — Assessment & Plan Note (Signed)
Patient received 1 L normal saline IV fluid rehydration today.  Patient may very well need additional IV fluid hydration later this week.  Patient states that he would prefer to wait till Thursday, 10/10/2013 for any additional IV fluid hydration.  Advised Kenneth Chan that we will call and check in on him either Wednesday afternoon or first thing Thursday morning to confirm appointment for further IV fluid rehydration.  Patient states that he has been trying to drink some protein drinks and some clear soup; this actually only able to take in approximately 3-400 mL's of fluid per day due to his severe mucositis.

## 2013-10-08 NOTE — Assessment & Plan Note (Signed)
Weakness secondary to disease progression, chemotherapy side effects, and poor nutrition due to severe mucositis issues.  See previous note regarding physical deconditioning.

## 2013-10-08 NOTE — Telephone Encounter (Signed)
Per [facility] staff phone call scheduled appts. Advised schedulers

## 2013-10-08 NOTE — Assessment & Plan Note (Signed)
At this is of most likely due to both of disease progression; as well as a chemotherapy side effect.  Patient was encouraged to remain as active as possible.

## 2013-10-15 ENCOUNTER — Other Ambulatory Visit (HOSPITAL_BASED_OUTPATIENT_CLINIC_OR_DEPARTMENT_OTHER): Payer: Medicare Other

## 2013-10-15 ENCOUNTER — Other Ambulatory Visit: Payer: Self-pay | Admitting: Internal Medicine

## 2013-10-15 ENCOUNTER — Other Ambulatory Visit: Payer: Self-pay | Admitting: Medical Oncology

## 2013-10-15 ENCOUNTER — Telehealth: Payer: Self-pay | Admitting: Medical Oncology

## 2013-10-15 ENCOUNTER — Ambulatory Visit (HOSPITAL_BASED_OUTPATIENT_CLINIC_OR_DEPARTMENT_OTHER): Payer: Medicare Other

## 2013-10-15 VITALS — BP 115/45 | HR 75 | Temp 97.2°F | Resp 20

## 2013-10-15 DIAGNOSIS — C3411 Malignant neoplasm of upper lobe, right bronchus or lung: Secondary | ICD-10-CM

## 2013-10-15 DIAGNOSIS — C3490 Malignant neoplasm of unspecified part of unspecified bronchus or lung: Secondary | ICD-10-CM

## 2013-10-15 DIAGNOSIS — C3410 Malignant neoplasm of upper lobe, unspecified bronchus or lung: Secondary | ICD-10-CM

## 2013-10-15 DIAGNOSIS — Z5111 Encounter for antineoplastic chemotherapy: Secondary | ICD-10-CM

## 2013-10-15 LAB — CBC WITH DIFFERENTIAL/PLATELET
BASO%: 1.1 % (ref 0.0–2.0)
BASOS ABS: 0.1 10*3/uL (ref 0.0–0.1)
EOS ABS: 0 10*3/uL (ref 0.0–0.5)
EOS%: 0.2 % (ref 0.0–7.0)
HCT: 26.7 % — ABNORMAL LOW (ref 38.4–49.9)
HEMOGLOBIN: 8.5 g/dL — AB (ref 13.0–17.1)
LYMPH%: 4.4 % — ABNORMAL LOW (ref 14.0–49.0)
MCH: 27.4 pg (ref 27.2–33.4)
MCHC: 31.7 g/dL — ABNORMAL LOW (ref 32.0–36.0)
MCV: 86.4 fL (ref 79.3–98.0)
MONO#: 0.9 10*3/uL (ref 0.1–0.9)
MONO%: 6.8 % (ref 0.0–14.0)
NEUT%: 87.5 % — ABNORMAL HIGH (ref 39.0–75.0)
NEUTROS ABS: 11.4 10*3/uL — AB (ref 1.5–6.5)
Platelets: 426 10*3/uL — ABNORMAL HIGH (ref 140–400)
RBC: 3.09 10*6/uL — ABNORMAL LOW (ref 4.20–5.82)
RDW: 16.5 % — ABNORMAL HIGH (ref 11.0–14.6)
WBC: 13 10*3/uL — ABNORMAL HIGH (ref 4.0–10.3)
lymph#: 0.6 10*3/uL — ABNORMAL LOW (ref 0.9–3.3)

## 2013-10-15 LAB — COMPREHENSIVE METABOLIC PANEL (CC13)
ALK PHOS: 68 U/L (ref 40–150)
ALT: 30 U/L (ref 0–55)
AST: 29 U/L (ref 5–34)
Albumin: 1.7 g/dL — ABNORMAL LOW (ref 3.5–5.0)
Anion Gap: 12 mEq/L — ABNORMAL HIGH (ref 3–11)
BUN: 17.4 mg/dL (ref 7.0–26.0)
CO2: 27 mEq/L (ref 22–29)
CREATININE: 1 mg/dL (ref 0.7–1.3)
Chloride: 99 mEq/L (ref 98–109)
GLUCOSE: 145 mg/dL — AB (ref 70–140)
Potassium: 3.3 mEq/L — ABNORMAL LOW (ref 3.5–5.1)
Sodium: 138 mEq/L (ref 136–145)
Total Bilirubin: 0.38 mg/dL (ref 0.20–1.20)
Total Protein: 5.9 g/dL — ABNORMAL LOW (ref 6.4–8.3)

## 2013-10-15 MED ORDER — DEXAMETHASONE SODIUM PHOSPHATE 20 MG/5ML IJ SOLN
INTRAMUSCULAR | Status: AC
  Filled 2013-10-15: qty 5

## 2013-10-15 MED ORDER — ONDANSETRON 16 MG/50ML IVPB (CHCC)
16.0000 mg | Freq: Once | INTRAVENOUS | Status: AC
  Administered 2013-10-15: 16 mg via INTRAVENOUS

## 2013-10-15 MED ORDER — ONDANSETRON 16 MG/50ML IVPB (CHCC)
INTRAVENOUS | Status: AC
  Filled 2013-10-15: qty 16

## 2013-10-15 MED ORDER — DIPHENHYDRAMINE HCL 50 MG/ML IJ SOLN
50.0000 mg | Freq: Once | INTRAMUSCULAR | Status: AC
  Administered 2013-10-15: 50 mg via INTRAVENOUS

## 2013-10-15 MED ORDER — FAMOTIDINE IN NACL 20-0.9 MG/50ML-% IV SOLN
INTRAVENOUS | Status: AC
  Filled 2013-10-15: qty 50

## 2013-10-15 MED ORDER — SODIUM CHLORIDE 0.9 % IV SOLN
337.2000 mg | Freq: Once | INTRAVENOUS | Status: AC
  Administered 2013-10-15: 340 mg via INTRAVENOUS
  Filled 2013-10-15: qty 34

## 2013-10-15 MED ORDER — PACLITAXEL CHEMO INJECTION 300 MG/50ML
150.0000 mg/m2 | Freq: Once | INTRAVENOUS | Status: AC
  Administered 2013-10-15: 276 mg via INTRAVENOUS
  Filled 2013-10-15: qty 46

## 2013-10-15 MED ORDER — SODIUM CHLORIDE 0.9 % IV SOLN
Freq: Once | INTRAVENOUS | Status: AC
  Administered 2013-10-15: 10:00:00 via INTRAVENOUS

## 2013-10-15 MED ORDER — DEXAMETHASONE SODIUM PHOSPHATE 20 MG/5ML IJ SOLN
20.0000 mg | Freq: Once | INTRAMUSCULAR | Status: AC
  Administered 2013-10-15: 20 mg via INTRAVENOUS

## 2013-10-15 MED ORDER — DIPHENHYDRAMINE HCL 50 MG/ML IJ SOLN
INTRAMUSCULAR | Status: AC
  Filled 2013-10-15: qty 1

## 2013-10-15 MED ORDER — FAMOTIDINE IN NACL 20-0.9 MG/50ML-% IV SOLN
20.0000 mg | Freq: Once | INTRAVENOUS | Status: AC
Start: 2013-10-15 — End: 2013-10-15
  Administered 2013-10-15: 20 mg via INTRAVENOUS

## 2013-10-15 NOTE — Progress Notes (Signed)
erroneous

## 2013-10-15 NOTE — Patient Instructions (Signed)
[facility] Discharge Instructions for Patients Receiving Chemotherapy  Today you received the following chemotherapy agents Taxol/Carboplatin To help prevent nausea and vomiting after your treatment, we encourage you to take your nausea medication as prescribed.  If you develop nausea and vomiting that is not controlled by your nausea medication, call the clinic.   BELOW ARE SYMPTOMS THAT SHOULD BE REPORTED IMMEDIATELY:  *FEVER GREATER THAN 100.5 F  *CHILLS WITH OR WITHOUT FEVER  NAUSEA AND VOMITING THAT IS NOT CONTROLLED WITH YOUR NAUSEA MEDICATION  *UNUSUAL SHORTNESS OF BREATH  *UNUSUAL BRUISING OR BLEEDING  TENDERNESS IN MOUTH AND THROAT WITH OR WITHOUT PRESENCE OF ULCERS  *URINARY PROBLEMS  *BOWEL PROBLEMS  UNUSUAL RASH Items with * indicate a potential emergency and should be followed up as soon as possible.  Feel free to call the clinic you have any questions or concerns. The clinic phone number is [phone].    

## 2013-10-15 NOTE — Telephone Encounter (Signed)
I instructed daughter , Levada Dy , to tell pt to get OTC calcium 600 mg tablets and take 1 tablet BID , with food.

## 2013-10-16 ENCOUNTER — Telehealth: Payer: Self-pay | Admitting: Internal Medicine

## 2013-10-16 ENCOUNTER — Ambulatory Visit (HOSPITAL_BASED_OUTPATIENT_CLINIC_OR_DEPARTMENT_OTHER): Payer: Medicare Other

## 2013-10-16 VITALS — BP 101/37 | HR 68 | Temp 98.0°F

## 2013-10-16 DIAGNOSIS — Z5189 Encounter for other specified aftercare: Secondary | ICD-10-CM

## 2013-10-16 DIAGNOSIS — C3411 Malignant neoplasm of upper lobe, right bronchus or lung: Secondary | ICD-10-CM

## 2013-10-16 DIAGNOSIS — C3410 Malignant neoplasm of upper lobe, unspecified bronchus or lung: Secondary | ICD-10-CM

## 2013-10-16 MED ORDER — PEGFILGRASTIM INJECTION 6 MG/0.6ML
6.0000 mg | Freq: Once | SUBCUTANEOUS | Status: AC
  Administered 2013-10-16: 6 mg via SUBCUTANEOUS
  Filled 2013-10-16: qty 0.6

## 2013-10-16 NOTE — Telephone Encounter (Signed)
gv and printed appt sched and avs for pt for Aug....sed added tx. °

## 2013-10-22 ENCOUNTER — Emergency Department (HOSPITAL_COMMUNITY): Payer: Medicare Other

## 2013-10-22 ENCOUNTER — Inpatient Hospital Stay (HOSPITAL_COMMUNITY)
Admission: EM | Admit: 2013-10-22 | Discharge: 2013-11-19 | DRG: 871 | Disposition: E | Payer: Medicare Other | Attending: Emergency Medicine | Admitting: Emergency Medicine

## 2013-10-22 ENCOUNTER — Telehealth: Payer: Self-pay | Admitting: *Deleted

## 2013-10-22 ENCOUNTER — Other Ambulatory Visit: Payer: Medicare Other

## 2013-10-22 ENCOUNTER — Encounter (HOSPITAL_COMMUNITY): Payer: Self-pay | Admitting: Emergency Medicine

## 2013-10-22 DIAGNOSIS — Z8249 Family history of ischemic heart disease and other diseases of the circulatory system: Secondary | ICD-10-CM | POA: Diagnosis not present

## 2013-10-22 DIAGNOSIS — I10 Essential (primary) hypertension: Secondary | ICD-10-CM | POA: Diagnosis present

## 2013-10-22 DIAGNOSIS — R6520 Severe sepsis without septic shock: Secondary | ICD-10-CM | POA: Diagnosis present

## 2013-10-22 DIAGNOSIS — T451X5A Adverse effect of antineoplastic and immunosuppressive drugs, initial encounter: Secondary | ICD-10-CM | POA: Diagnosis present

## 2013-10-22 DIAGNOSIS — N289 Disorder of kidney and ureter, unspecified: Secondary | ICD-10-CM

## 2013-10-22 DIAGNOSIS — R5383 Other fatigue: Secondary | ICD-10-CM | POA: Diagnosis not present

## 2013-10-22 DIAGNOSIS — Z9981 Dependence on supplemental oxygen: Secondary | ICD-10-CM | POA: Diagnosis not present

## 2013-10-22 DIAGNOSIS — Z8042 Family history of malignant neoplasm of prostate: Secondary | ICD-10-CM

## 2013-10-22 DIAGNOSIS — L03115 Cellulitis of right lower limb: Secondary | ICD-10-CM

## 2013-10-22 DIAGNOSIS — Z7982 Long term (current) use of aspirin: Secondary | ICD-10-CM | POA: Diagnosis not present

## 2013-10-22 DIAGNOSIS — N390 Urinary tract infection, site not specified: Secondary | ICD-10-CM | POA: Diagnosis present

## 2013-10-22 DIAGNOSIS — D61818 Other pancytopenia: Secondary | ICD-10-CM

## 2013-10-22 DIAGNOSIS — G9341 Metabolic encephalopathy: Secondary | ICD-10-CM | POA: Diagnosis present

## 2013-10-22 DIAGNOSIS — E785 Hyperlipidemia, unspecified: Secondary | ICD-10-CM | POA: Diagnosis present

## 2013-10-22 DIAGNOSIS — E119 Type 2 diabetes mellitus without complications: Secondary | ICD-10-CM | POA: Diagnosis present

## 2013-10-22 DIAGNOSIS — A419 Sepsis, unspecified organism: Secondary | ICD-10-CM | POA: Diagnosis present

## 2013-10-22 DIAGNOSIS — I959 Hypotension, unspecified: Secondary | ICD-10-CM

## 2013-10-22 DIAGNOSIS — J449 Chronic obstructive pulmonary disease, unspecified: Secondary | ICD-10-CM | POA: Diagnosis present

## 2013-10-22 DIAGNOSIS — J9620 Acute and chronic respiratory failure, unspecified whether with hypoxia or hypercapnia: Secondary | ICD-10-CM | POA: Diagnosis present

## 2013-10-22 DIAGNOSIS — R404 Transient alteration of awareness: Secondary | ICD-10-CM | POA: Diagnosis present

## 2013-10-22 DIAGNOSIS — K121 Other forms of stomatitis: Secondary | ICD-10-CM | POA: Diagnosis present

## 2013-10-22 DIAGNOSIS — Z79899 Other long term (current) drug therapy: Secondary | ICD-10-CM

## 2013-10-22 DIAGNOSIS — Z9849 Cataract extraction status, unspecified eye: Secondary | ICD-10-CM | POA: Diagnosis not present

## 2013-10-22 DIAGNOSIS — D61810 Antineoplastic chemotherapy induced pancytopenia: Secondary | ICD-10-CM | POA: Diagnosis present

## 2013-10-22 DIAGNOSIS — Z515 Encounter for palliative care: Secondary | ICD-10-CM

## 2013-10-22 DIAGNOSIS — K921 Melena: Secondary | ICD-10-CM | POA: Diagnosis present

## 2013-10-22 DIAGNOSIS — E039 Hypothyroidism, unspecified: Secondary | ICD-10-CM | POA: Diagnosis present

## 2013-10-22 DIAGNOSIS — C3490 Malignant neoplasm of unspecified part of unspecified bronchus or lung: Secondary | ICD-10-CM | POA: Diagnosis present

## 2013-10-22 DIAGNOSIS — R6521 Severe sepsis with septic shock: Secondary | ICD-10-CM

## 2013-10-22 DIAGNOSIS — D649 Anemia, unspecified: Secondary | ICD-10-CM

## 2013-10-22 DIAGNOSIS — R5381 Other malaise: Secondary | ICD-10-CM | POA: Diagnosis present

## 2013-10-22 DIAGNOSIS — L02419 Cutaneous abscess of limb, unspecified: Secondary | ICD-10-CM | POA: Diagnosis present

## 2013-10-22 DIAGNOSIS — J4489 Other specified chronic obstructive pulmonary disease: Secondary | ICD-10-CM | POA: Diagnosis present

## 2013-10-22 DIAGNOSIS — L03119 Cellulitis of unspecified part of limb: Secondary | ICD-10-CM

## 2013-10-22 DIAGNOSIS — L03116 Cellulitis of left lower limb: Secondary | ICD-10-CM

## 2013-10-22 DIAGNOSIS — K1230 Oral mucositis (ulcerative), unspecified: Secondary | ICD-10-CM

## 2013-10-22 DIAGNOSIS — N179 Acute kidney failure, unspecified: Secondary | ICD-10-CM | POA: Diagnosis present

## 2013-10-22 DIAGNOSIS — Z66 Do not resuscitate: Secondary | ICD-10-CM | POA: Diagnosis not present

## 2013-10-22 DIAGNOSIS — J189 Pneumonia, unspecified organism: Secondary | ICD-10-CM

## 2013-10-22 DIAGNOSIS — Z87891 Personal history of nicotine dependence: Secondary | ICD-10-CM

## 2013-10-22 LAB — CBC WITH DIFFERENTIAL/PLATELET
BASOS ABS: 0 10*3/uL (ref 0.0–0.1)
Basophils Relative: 0 % (ref 0–1)
EOS ABS: 0 10*3/uL (ref 0.0–0.7)
Eosinophils Relative: 0 % (ref 0–5)
HCT: 23.3 % — ABNORMAL LOW (ref 39.0–52.0)
Hemoglobin: 7.4 g/dL — ABNORMAL LOW (ref 13.0–17.0)
LYMPHS ABS: 0.1 10*3/uL — AB (ref 0.7–4.0)
Lymphocytes Relative: 19 % (ref 12–46)
MCH: 27.6 pg (ref 26.0–34.0)
MCHC: 31.8 g/dL (ref 30.0–36.0)
MCV: 86.9 fL (ref 78.0–100.0)
MONOS PCT: 63 % — AB (ref 3–12)
Monocytes Absolute: 0.4 10*3/uL (ref 0.1–1.0)
Neutro Abs: 0.1 10*3/uL — ABNORMAL LOW (ref 1.7–7.7)
Neutrophils Relative %: 18 % — ABNORMAL LOW (ref 43–77)
PLATELETS: 60 10*3/uL — AB (ref 150–400)
RBC: 2.68 MIL/uL — ABNORMAL LOW (ref 4.22–5.81)
RDW: 16.2 % — AB (ref 11.5–15.5)
WBC: 0.6 10*3/uL — AB (ref 4.0–10.5)

## 2013-10-22 LAB — URINALYSIS, ROUTINE W REFLEX MICROSCOPIC
GLUCOSE, UA: NEGATIVE mg/dL
Ketones, ur: NEGATIVE mg/dL
LEUKOCYTES UA: NEGATIVE
NITRITE: NEGATIVE
PH: 5 (ref 5.0–8.0)
Protein, ur: 100 mg/dL — AB
Specific Gravity, Urine: 1.017 (ref 1.005–1.030)
Urobilinogen, UA: 1 mg/dL (ref 0.0–1.0)

## 2013-10-22 LAB — URINE MICROSCOPIC-ADD ON

## 2013-10-22 LAB — PRO B NATRIURETIC PEPTIDE: PRO B NATRI PEPTIDE: 7739 pg/mL — AB (ref 0–450)

## 2013-10-22 LAB — COMPREHENSIVE METABOLIC PANEL
ALT: 18 U/L (ref 0–53)
AST: 25 U/L (ref 0–37)
Albumin: 2 g/dL — ABNORMAL LOW (ref 3.5–5.2)
Alkaline Phosphatase: 82 U/L (ref 39–117)
Anion gap: 17 — ABNORMAL HIGH (ref 5–15)
BUN: 43 mg/dL — ABNORMAL HIGH (ref 6–23)
CALCIUM: 7.5 mg/dL — AB (ref 8.4–10.5)
CO2: 25 mEq/L (ref 19–32)
Chloride: 100 mEq/L (ref 96–112)
Creatinine, Ser: 1.92 mg/dL — ABNORMAL HIGH (ref 0.50–1.35)
GFR calc Af Amer: 36 mL/min — ABNORMAL LOW (ref >90)
GFR calc non Af Amer: 31 mL/min — ABNORMAL LOW (ref >90)
Glucose, Bld: 128 mg/dL — ABNORMAL HIGH (ref 70–99)
Potassium: 3.6 mEq/L — ABNORMAL LOW (ref 3.7–5.3)
Sodium: 142 mEq/L (ref 137–147)
TOTAL PROTEIN: 6.5 g/dL (ref 6.0–8.3)
Total Bilirubin: 0.5 mg/dL (ref 0.3–1.2)

## 2013-10-22 LAB — PREPARE RBC (CROSSMATCH)

## 2013-10-22 LAB — PROTIME-INR
INR: 1.42 (ref 0.00–1.49)
Prothrombin Time: 17.4 seconds — ABNORMAL HIGH (ref 11.6–15.2)

## 2013-10-22 LAB — ABO/RH: ABO/RH(D): O POS

## 2013-10-22 LAB — GLUCOSE, CAPILLARY: Glucose-Capillary: 122 mg/dL — ABNORMAL HIGH (ref 70–99)

## 2013-10-22 LAB — I-STAT TROPONIN, ED: Troponin i, poc: 0.14 ng/mL (ref 0.00–0.08)

## 2013-10-22 LAB — POC OCCULT BLOOD, ED: Fecal Occult Bld: POSITIVE — AB

## 2013-10-22 LAB — I-STAT CG4 LACTIC ACID, ED: Lactic Acid, Venous: 2.15 mmol/L (ref 0.5–2.2)

## 2013-10-22 LAB — TROPONIN I: Troponin I: <0.3 ng/mL (ref <0.30)

## 2013-10-22 LAB — APTT: aPTT: 36 seconds (ref 24–37)

## 2013-10-22 MED ORDER — SODIUM CHLORIDE 0.9 % IV SOLN
INTRAVENOUS | Status: DC
  Administered 2013-10-23: 03:00:00 via INTRAVENOUS

## 2013-10-22 MED ORDER — SODIUM CHLORIDE 0.9 % IV SOLN: 250.0000 mL | INTRAVENOUS | Status: DC | PRN

## 2013-10-22 MED ORDER — IPRATROPIUM-ALBUTEROL 0.5-2.5 (3) MG/3ML IN SOLN
3.0000 mL | RESPIRATORY_TRACT | Status: DC
Start: 2013-10-23 — End: 2013-10-23
  Administered 2013-10-22 – 2013-10-23 (×3): 3 mL via RESPIRATORY_TRACT
  Filled 2013-10-22 (×3): qty 3

## 2013-10-22 MED ORDER — PIPERACILLIN-TAZOBACTAM 3.375 G IVPB
3.3750 g | Freq: Three times a day (TID) | INTRAVENOUS | Status: DC
  Administered 2013-10-23 (×3): 3.375 g via INTRAVENOUS
  Filled 2013-10-22 (×4): qty 50

## 2013-10-22 MED ORDER — ACETAMINOPHEN 650 MG RE SUPP
650.0000 mg | RECTAL | Status: DC | PRN
  Administered 2013-10-22: 650 mg via RECTAL
  Filled 2013-10-22: qty 1

## 2013-10-22 MED ORDER — VANCOMYCIN HCL IN DEXTROSE 1-5 GM/200ML-% IV SOLN
1000.0000 mg | Freq: Once | INTRAVENOUS | Status: AC
  Administered 2013-10-22: 1000 mg via INTRAVENOUS
  Filled 2013-10-22: qty 200

## 2013-10-22 MED ORDER — ALBUTEROL SULFATE (2.5 MG/3ML) 0.083% IN NEBU: 2.5000 mg | INHALATION_SOLUTION | RESPIRATORY_TRACT | Status: DC | PRN

## 2013-10-22 MED ORDER — SODIUM CHLORIDE 0.9 % IV SOLN: 10.0000 mL/h | Freq: Once | INTRAVENOUS | Status: DC

## 2013-10-22 MED ORDER — SODIUM CHLORIDE 0.9 % IV BOLUS (SEPSIS)
1000.0000 mL | Freq: Once | INTRAVENOUS | Status: AC
  Administered 2013-10-22: 1000 mL via INTRAVENOUS

## 2013-10-22 MED ORDER — VANCOMYCIN HCL IN DEXTROSE 750-5 MG/150ML-% IV SOLN
750.0000 mg | INTRAVENOUS | Status: DC
  Administered 2013-10-23: 750 mg via INTRAVENOUS
  Filled 2013-10-22: qty 150

## 2013-10-22 MED ORDER — PIPERACILLIN-TAZOBACTAM 3.375 G IVPB 30 MIN
3.3750 g | Freq: Once | INTRAVENOUS | Status: AC
  Administered 2013-10-22: 3.375 g via INTRAVENOUS
  Filled 2013-10-22: qty 50

## 2013-10-22 MED ORDER — SODIUM CHLORIDE 0.9 % IV BOLUS (SEPSIS)
2000.0000 mL | Freq: Once | INTRAVENOUS | Status: AC
  Administered 2013-10-22: 2000 mL via INTRAVENOUS

## 2013-10-22 MED ORDER — SODIUM CHLORIDE 0.9 % IV SOLN
10.0000 mL/h | Freq: Once | INTRAVENOUS | Status: DC
Start: 2013-10-22 — End: 2013-10-24

## 2013-10-22 MED ORDER — SODIUM CHLORIDE 0.9 % IV SOLN
1000.0000 mL | INTRAVENOUS | Status: DC
  Administered 2013-10-22 – 2013-10-23 (×2): 1000 mL via INTRAVENOUS

## 2013-10-22 NOTE — ED Notes (Signed)
Pt resting in bed looking around at this time, son at bedside.

## 2013-10-22 NOTE — Progress Notes (Signed)
ANTIBIOTIC CONSULT NOTE - INITIAL  Pharmacy Consult for Vancomycin / Zosyn Indication: Sepsis  No Known Allergies  Patient Measurements:   Adjusted Body Weight:  Vital Signs: Temp: 103.1 F (39.5 C) (08/04 1825) Temp src: Rectal (08/04 1825) BP: 128/80 mmHg (08/04 1830) Pulse Rate: 132 (08/04 1830) Intake/Output from previous day:   Intake/Output from this shift:    Labs: No results found for this basename: WBC, HGB, PLT, LABCREA, CREATININE,  in the last 72 hours The CrCl is unknown because both a height and weight (above a minimum accepted value) are required for this calculation. No results found for this basename: VANCOTROUGH, Corlis Leak, VANCORANDOM, [place], GENTPEAK, GENTRANDOM, [place], TOBRAPEAK, TOBRARND, AMIKACINPEAK, AMIKACINTROU, AMIKACIN,  in the last 72 hours   Microbiology: Recent Results (from the past 720 hour(s))  TECHNOLOGIST REVIEW     Status: None   Collection Time    09/24/13 11:04 AM      Result Value Ref Range Status   Technologist Review Rare Meta   Final  RAPID STREP SCREEN     Status: None   Collection Time    09/28/13  4:47 PM      Result Value Ref Range Status   Streptococcus, Group A Screen (Direct) NEGATIVE  NEGATIVE Final   Comment: (NOTE)     A Rapid Antigen test may result negative if the antigen level in the     sample is below the detection level of this test. The FDA has not     cleared this test as a stand-alone test therefore the rapid antigen     negative result has reflexed to a Group A Strep culture.  CULTURE, GROUP A STREP     Status: None   Collection Time    09/28/13  4:47 PM      Result Value Ref Range Status   Specimen Description THROAT   Final   Special Requests NONE   Final   Culture     Final   Value: No Beta Hemolytic Streptococci Isolated     Performed at [facility]   Report Status 09/30/2013 FINAL   Final    Medical History: Past Medical History  Diagnosis Date  . COPD (chronic  obstructive pulmonary disease)     on night 02, PFT's 10/17/2008 FEV1 1.31. (62%) ratio 36 -HFA 50% January 31,2011  . DM type 2 (diabetes mellitus, type 2)   . Hyperlipidemia   . Hypothyroidism   . Hypertension     DC ace 02/09/09 (cough) > resolved  . Diverticulosis of colon   . Gallstones   . Lung mass 06-2013  . 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 Shoulder fracture 06-2013  . Lung cancer 09/16/2013    Assessment: 87 yoM with stage IV NSCLCa s/p 2 cycles carboplatin/paclitaxel, last on 7/28, presents to [facility] 2/2 falls,AMS, incontinence, confusion and SOB.  PMHx also includes mucositis, fatigue, pancytopenia, recent AKI, and today noted bilateral lower extremity redness and decubitus ulcers.  Pharmacy consulted to dose vancomycin and zosyn for empiric sepsis.  First doses of each given in ED.  8/4 >> Vancomycin  >> 8/4 >> Zosyn  >>    Tmax: 103.1 WBCs: pending (neulasta 7/29) Renal: SCr 1.92 -CrCl 30 CG/N  8/4 blood: ordered 8/4 urine: ordered   Goal of Therapy:  Vancomycin trough level 15-20 mcg/ml Eradication of infection  Plan:  Vancomycin 1g x 1 in ED, then 750mg  IV q24h Zosyn 3.375g IV q8h (infuse over 4 hours) F/u renal fxn closely  to adjust antibiotics  Ralene Bathe, PharmD, BCPS 10/31/2013, 7:34 PM  Pager: [phone]

## 2013-10-22 NOTE — ED Notes (Signed)
Bed: RESA Expected date:  Expected time:  Means of arrival:  Comments: EMS-SOB 

## 2013-10-22 NOTE — H&P (Signed)
PULMONARY / CRITICAL CARE MEDICINE   Name: Kenneth Chan MRN: [id number] DOB: 03-Jul-1930    ADMISSION DATE:  10/20/2013 CONSULTATION DATE:  11/06/2013  REFERRING MD :  EDP  CHIEF COMPLAINT:  Weakness  INITIAL PRESENTATION: 80 yo male stage IV non-small cell T1N1M0 lung ca on chemo with  presented to [facility] ED 8/4 c/o weakness x 1 week. Worse 8/3. Was unable to stand.  In ED noted to be pancytopenic, febrile, and hypotensive. Followed by MW as outpt. PCCM asked to see.   STUDIES:   SIGNIFICANT EVENTS: 8/4 Admitted to [facility] ICU  HISTORY OF PRESENT ILLNESS:  80 year old male with PMH as below, which includes stage IV non-small cell lung cancer currently on carboplatin/paclitaxel for palliative therapy s/p 2 treatments. Most recent 7/28, received  Neupogen. He also has ulcerative mucositis and pancytopenia as a consequence of the chemo. Also, COPD (on O2 at night), DM, HTN, hypothyroid. He is an office pt of MW, and Dr Julien Nordmann for oncology. He presented to [facility] ED 8/4 complaining of weakness x 1 week. He was found to be hypoxic, hypotensive, and pancytopenic, and was transfused with blood and plt in ED. PCCM asked to see.   PAST MEDICAL HISTORY :  Past Medical History  Diagnosis Date  . COPD (chronic obstructive pulmonary disease)     on night 02, PFT's 10/17/2008 FEV1 1.31. (62%) ratio 36 -HFA 50% January 31,2011  . DM type 2 (diabetes mellitus, type 2)   . Hyperlipidemia   . Hypothyroidism   . Hypertension     DC ace 02/09/09 (cough) > resolved  . Diverticulosis of colon   . Gallstones   . Lung mass 06-2013  . 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 Shoulder fracture 06-2013  . Lung cancer 09/16/2013   Past Surgical History  Procedure Laterality Date  . Inguinal hernia repair    . Cataract extraction    . Video bronchoscopy Bilateral 09/03/2013    Procedure: VIDEO BRONCHOSCOPY WITHOUT FLUORO;  Surgeon: Tanda Rockers, MD;  Location: [facility] ENDOSCOPY;  Service: Cardiopulmonary;  Laterality:  Bilateral;   Prior to Admission medications   Medication Sig Start Date End Date Taking? Authorizing Provider  Alum & Mag Hydroxide-Simeth (MAGIC MOUTHWASH W/LIDOCAINE) SOLN Take 5 mLs by mouth 4 (four) times daily as needed for mouth pain. 10/01/13  Yes Drue Second, NP  aspirin EC 81 MG tablet Take 81 mg by mouth at bedtime.   Yes Historical Provider, MD  budesonide-formoterol (SYMBICORT) 160-4.5 MCG/ACT inhaler Inhale 2 puffs into the lungs 2 (two) times daily. 2 puffs first thing in am, 2 puffs again in pm about 12 hours later. 08/16/13  Yes Colon Branch, MD  ferrous gluconate (FERGON) 324 MG tablet Take 324 mg by mouth 2 (two) times daily with a meal.   Yes Historical Provider, MD  gemfibrozil (LOPID) 600 MG tablet Take 600 mg by mouth 2 (two) times daily before a meal.   Yes Historical Provider, MD  HYDROcodone-acetaminophen (HYCET) 7.5-325 mg/15 ml solution Take 5 mLs by mouth 4 (four) times daily as needed for moderate pain. 10/02/13  Yes Drue Second, NP  levothyroxine (SYNTHROID, LEVOTHROID) 175 MCG tablet Take 175 mcg by mouth daily before breakfast.   Yes Historical Provider, MD  metFORMIN (GLUCOPHAGE) 500 MG tablet Take 750 mg by mouth 2 (two) times daily with a meal. Take 1.5 tablet twice dialy   Yes Historical Provider, MD  multivitamin-iron-minerals-folic acid (CENTRUM) chewable tablet Chew 1 tablet by mouth daily.  Yes Historical Provider, MD  ONE TOUCH ULTRA TEST test strip CHECK BLOOD SUGAR AS DIRECTED. 12/27/11  Yes Colon Branch, MD  prochlorperazine (COMPAZINE) 10 MG tablet Take 10 mg by mouth every 8 (eight) hours as needed (nausea).  09/19/13  Yes Historical Provider, MD  valsartan-hydrochlorothiazide (DIOVAN-HCT) 320-25 MG per tablet Take 0.5 tablets by mouth daily.    Yes Historical Provider, MD  verapamil (CALAN) 40 MG tablet Take 40 mg by mouth 3 (three) times daily. 07/05/13  Yes Colon Branch, MD   No Known Allergies  FAMILY HISTORY:  Family History  Problem Relation Age of  Onset  . Hypertension Mother   . Coronary artery disease Mother     MI  . Diabetes Neg Hx   . Stroke Neg Hx   . Cancer Neg Hx     colon and prostate  . Prostate cancer Paternal Grandfather     with mets to Bone    SOCIAL HISTORY:  reports that he quit smoking about 35 years ago. His smoking use included Cigarettes. He has a 25 pack-year smoking history. He has quit using smokeless tobacco. He reports that he does not drink alcohol or use illicit drugs.  REVIEW OF SYSTEMS:  Unable as pt is lethargic  SUBJECTIVE:   VITAL SIGNS: Temp:  [101.2 F (38.4 C)-103.1 F (39.5 C)] 101.2 F (38.4 C) (08/04 2029) Pulse Rate:  [106-136] 108 (08/04 2130) Resp:  [22-29] 25 (08/04 2130) BP: (92-141)/(32-80) 93/43 mmHg (08/04 2130) SpO2:  [92 %-100 %] 96 % (08/04 2130) Weight:  [70.308 kg (155 lb)] 70.308 kg (155 lb) (08/04 1927) HEMODYNAMICS:   VENTILATOR SETTINGS:   INTAKE / OUTPUT: No intake or output data in the 24 hours ending 11/01/2013 2150  PHYSICAL EXAMINATION: General:  Elderly, cachectic male in mild respiratory distress  Neuro:  Alert, oriented. Too lethargic to sustain short conversation.  HEENT:  [place]/AT, mild JVD, dried blood around oral mucosa.  Cardiovascular:  RRR, no MRG Lungs:  Poor air movement R. Clear left. Mild distress on NRB Abdomen:  Soft, non-tender, non-distended Musculoskeletal:  NO acute deformity Skin:Erythemia/petechia to BLE. Stage 2 wounds to Sacrum, BL heels.   LABS:  CBC  Recent Labs Lab 11/03/2013 1917  WBC 0.6*  HGB 7.4*  HCT 23.3*  PLT 60*   Coag's  Recent Labs Lab 11/11/2013 1917  APTT 36  INR 1.42   BMET  Recent Labs Lab 11/01/2013 1917  NA 142  K 3.6*  CL 100  CO2 25  BUN 43*  CREATININE 1.92*  GLUCOSE 128*   Electrolytes  Recent Labs Lab 11/15/2013 1917  CALCIUM 7.5*   Sepsis Markers  Recent Labs Lab 10/31/2013 1930  LATICACIDVEN 2.15   ABG No results found for this basename: PHART, PCO2ART, PO2ART,  in the last  168 hours Liver Enzymes  Recent Labs Lab 11/13/2013 1917  AST 25  ALT 18  ALKPHOS 82  BILITOT 0.5  ALBUMIN 2.0*   Cardiac Enzymes  Recent Labs Lab 10/28/2013 1927 11/06/2013 1949  TROPONINI  --  <0.30  PROBNP 7739.0*  --    Glucose No results found for this basename: GLUCAP,  in the last 168 hours  Imaging No results found.   ASSESSMENT / PLAN:  PULMONARY A: Acute on chronic hypoxic respiratory failure - 2nd to PNA NSCLC Stage IV on palliative chemo - last tx 7/28 COPD without acute exacerbation P:   Supplemental O2 as needed to maintain O2 sat greater than 92% Scheduled BD's  F/u CXR in AM Pulm hygiene Patient is DNI  CARDIOVASCULAR A:  Severe Sepsis H/o HTN, MAT P:  MAP goal >= 65 Gentle IVF hydration to maintain MAP goal Hold home antihypertensives (valsartan-hctz, verapamil) CVC placed, currently on phenylephrine, wean as able  RENAL A:   AKI P:   Gentle diuresis Follow BMP  GASTROINTESTINAL A:   Melena GI ppx P:   Guaiac stool  NPO PPI  HEMATOLOGIC A:   Pancytopenia 2nd to chemo P:  Continue to trasfuse 1 unit PRBC and Plt per EDP orders. Follow CBC Hold ASA Received neupogen 7/29  INFECTIOUS A:   Severe sepsis - PNA vs UTI vs BLE cellulitis.  P:   BCx2 8/4>>> UC 8/4 >>> Abx: Vanc, start date 8/4 Abx: Zosyn, start date 8/4 Follow PCT Follow WBC and fever curve  ENDOCRINE A:   DM Hypothyroid P:   CBG checks q4hours SSI if Glucose consistently > 180.  Hold home synthroid until able to take PO. Hold metformin  NEUROLOGIC A:  Acute encephalopathy 2nd to severe sepsis.  P:   Monitor  Global: Family is aware of poor prognosis prior to this acute event, and understands that this only complicates the situation. Pt has always wished for full code, but does not want intubation. However he cannot stay awake long enough to have this discussion now. Son will speak with his sisters tonight to determine how aggressive we are to be  with his care and let us know. For time being we will abide by pt's wishes of DNI, but full resuscitation otherwise.   [name], ACNP [facility] Pager [phone] or [phone]  I have personally obtained a history, examined the patient, evaluated laboratory and imaging results, formulated the assessment and plan and placed orders. CRITICAL CARE: The patient is critically ill with multiple organ systems failure and requires high complexity decision making for assessment and support, frequent evaluation and titration of therapies, application of advanced monitoring technologies and extensive interpretation of multiple databases. Critical Care Time devoted to patient care services described in this note is 60 minutes.   Baltazar Apo, MD, PhD 2013/11/16, 9:40 AM [facility] [phone] or if no answer [phone]

## 2013-10-22 NOTE — ED Notes (Signed)
Per EMS- Patient is a home health hospice. Patient lives with his son. Family called EMS stating that the patient is falling a lot, incontinent of urine and stool, and confused. Patient is suppose to be on home O2, but would not wear. Room air sats 75%. EMS placed patient on a NRB and sats increased to 95%. Rhonchi throughout. Redness to bilateral lower extremities and decubitus x 2 to the coccyx and right buttocks.

## 2013-10-22 NOTE — Telephone Encounter (Signed)
Pt's daughters Baker Janus and Levada Dy and wanted to speak to RN in the lobby.  Pt is unable to come in for lab work today.  He is very weak and does not want to have any more chemo.  He feels worse after his 2nd cycle of chemo.  Baker Janus states that his lower legs are a light pink color.  No swelling noted today, no pain, or warmth.  Informed Dr Vista Mink, advised they talk to [place] and Dr Vista Mink about stopping tx at next f/u 8/18.    Spoke with Dr Vista Mink, he states to keep watiching his legs, if any swelling, increased redness or pain develops to let us know and we may need to r/o a DVT.  SLJ

## 2013-10-22 NOTE — ED Provider Notes (Signed)
TIME SEEN: 7:01 PM  CHIEF COMPLAINT: Altered mental status, weakness, fever, cough  HPI: Patient is a 80 y.o. M with history of COPD on oxygen at night, hypertension, hyperlipidemia, diabetes, hypothyroidism, lung cancer who has had 2 rounds of chemotherapy who presents to the emergency department with EMS after his son called because patient was unable to stand and has been weak for the past day. He has had a wet cough since starting chemotherapy several weeks ago. Tinel's reports he has had redness and swelling to his lower extremities for several weeks as well. He has had bleeding from his mouth and his last chemotherapy as well several days ago. He is not on anticoagulation per the son. Patient denies any headache, neck pain, chest pain or abdominal pain. He states he does not feel short of breath. Patient confirms that he is a DO NOT INTUBATE but states he would want CPR, cardioversion/defibrillation if needed.  ROS: See HPI Constitutional:  fever  Eyes: no drainage  ENT: no runny nose   Cardiovascular:  no chest pain  Resp: no SOB  GI: no vomiting GU: no dysuria Integumentary: no rash  Allergy: no hives  Musculoskeletal: no leg swelling  Neurological: no slurred speech ROS otherwise negative  PAST MEDICAL HISTORY/PAST SURGICAL HISTORY:  Past Medical History  Diagnosis Date  . COPD (chronic obstructive pulmonary disease)     on night 02, PFT's 10/17/2008 FEV1 1.31. (62%) ratio 36 -HFA 50% January 31,2011  . DM type 2 (diabetes mellitus, type 2)   . Hyperlipidemia   . Hypothyroidism   . Hypertension     DC ace 02/09/09 (cough) > resolved  . Diverticulosis of colon   . Gallstones   . Lung mass 06-2013  . 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 Shoulder fracture 06-2013  . Lung cancer 09/16/2013    MEDICATIONS:  Prior to Admission medications   Medication Sig Start Date End Date Taking? Authorizing Provider  Alum & Mag Hydroxide-Simeth (MAGIC MOUTHWASH W/LIDOCAINE) SOLN Take 5  mLs by mouth 4 (four) times daily as needed for mouth pain. 10/01/13   Drue Second, NP  aspirin EC 81 MG tablet Take 81 mg by mouth at bedtime.    Historical Provider, MD  budesonide-formoterol (SYMBICORT) 160-4.5 MCG/ACT inhaler Inhale 2 puffs into the lungs 2 (two) times daily. 2 puffs first thing in am, 2 puffs again in pm about 12 hours later. 08/16/13   Colon Branch, MD  ciprofloxacin (CIPRO) 500 MG tablet Take 1 tablet (500 mg total) by mouth 2 (two) times daily. 10/01/13   Drue Second, NP  ferrous sulfate 325 (65 FE) MG tablet TAKE 1 TABLET (325 MG TOTAL) BY MOUTH 2 (TWO) TIMES DAILY WITH A MEAL. 09/23/13   Colon Branch, MD  gemfibrozil (LOPID) 600 MG tablet TAKE 1 TABLET TWICE A DAY 07/05/13   Colon Branch, MD  HYDROcodone-acetaminophen (HYCET) 7.5-325 mg/15 ml solution Take 5 mLs by mouth 4 (four) times daily as needed for moderate pain. 10/02/13   Drue Second, NP  levothyroxine (SYNTHROID, LEVOTHROID) 175 MCG tablet Take 175 mcg by mouth daily before breakfast.    Historical Provider, MD  metFORMIN (GLUCOPHAGE) 500 MG tablet TAKE 1 AND 1/2 TABLETS TWICE A DAY 07/24/13   Colon Branch, MD  multivitamin-iron-minerals-folic acid (CENTRUM) chewable tablet Chew 1 tablet by mouth daily.      Historical Provider, MD  ONE TOUCH ULTRA TEST test strip CHECK BLOOD SUGAR AS DIRECTED. 12/27/11   Colon Branch,  MD  prochlorperazine (COMPAZINE) 10 MG tablet  09/19/13   Historical Provider, MD  valsartan-hydrochlorothiazide (DIOVAN-HCT) 320-25 MG per tablet Take 0.5 tablets by mouth daily.     Historical Provider, MD  verapamil (CALAN) 40 MG tablet Take 40 mg by mouth 3 (three) times daily. 07/05/13   Colon Branch, MD    ALLERGIES:  No Known Allergies  SOCIAL HISTORY:  History  Substance Use Topics  . Smoking status: Former Smoker -- 1.00 packs/day for 25 years    Types: Cigarettes    Quit date: 03/21/1978  . Smokeless tobacco: Former Systems developer  . Alcohol Use: No    FAMILY HISTORY: Family History  Problem Relation  Age of Onset  . Hypertension Mother   . Coronary artery disease Mother     MI  . Diabetes Neg Hx   . Stroke Neg Hx   . Cancer Neg Hx     colon and prostate  . Prostate cancer Paternal Grandfather     with mets to Bone     EXAM: BP 128/80  Pulse 132  Temp(Src) 103.1 F (39.5 C) (Rectal)  Resp 24  SpO2 95% CONSTITUTIONAL: Alert and oriented to person and place and responds appropriately to questions. Elderly, chronically ill-appearing, in respiratory distress HEAD: Normocephalic EYES: Conjunctivae clear, PERRL ENT: normal nose; no rhinorrhea; dry mucous membranes, pharynx without lesions, patient has dry blood in his right nostril and around his lips with no active bleeding NECK: Supple, no meningismus, no LAD  CARD: RRR; S1 and S2 appreciated; no murmurs, no clicks, no rubs, no gallops RESP: Normal chest excursion without splinting; patient is tachypneic, speaking short sentences, has mild to moderate respiratory distress, is hypoxic on room air with oxygen saturations in the 70s, patient has rhonchorous breath sounds diffusely, wet cough ABD/GI: Normal bowel sounds; non-distended; soft, non-tender, no rebound, no guarding RECTAL:  Patient has melanotic stools, no gross blood, normal rectal tone BACK:  The back appears normal and is non-tender to palpation, there is no CVA tenderness EXT: Normal ROM in all joints; non-tender to palpation; no edema; normal capillary refill; no cyanosis    SKIN: Normal color for age and race; warm, patient has erythema and warmth his bilateral lower extremities to the midcalf with some skin breakdown on the heels, he has sacral decubitus ulcers with no purulent drainage NEURO: Moves all extremities equally PSYCH: The patient's mood and manner are appropriate. Grooming and personal hygiene are appropriate.  MEDICAL DECISION MAKING: Patient here with likely pneumonia, cellulitis, possible GI bleed. He is hypertensive, febrile, tachycardic, hypoxic. We'll  obtain labs, cultures, urine, chest x-ray. We'll give IV fluids, broad-spectrum antibiotics. She will need admission. Discuss with patient and family at bedside.  ED PROGRESS: Patient appears to have pneumonia. He is receiving broad-spectrum antibiotics. Troponin is slightly elevated but suspect this is likely due to strain. Other labs and urine are pending.    Urine shows hemoglobin and many bacteria. Culture pending. Patient is pancytopenic. Absolute neutrophil at 0.1. Platelets are 60 and hemoglobin is 7.4. We'll transfuse platelets and PRBCs. Patient's blood pressure continues to drop despite IV fluids. When I went to reevaluate him his systolic blood pressure ranges from 70s to 100s with maps in the 50s to low 60s. Heart rate is in the 110s-120s.  He is still on a NRB, oxygen saturation in the mid-90s. Patient has been consented for blood products. [place] bedtime. Given his progressive decline it being full code except for intubation, will discuss with intensivist.  Patient and family have agreed to vasopressors and central line as needed.     8:30 PM  D/w Dr. Elsworth Soho with CC for admission.   EKG Interpretation  Date/Time:  Tuesday October 22 2013 18:35:50 EDT Ventricular Rate:  199 PR Interval:  77 QRS Duration: 80 QT Interval:  245 QTC Calculation: 446 R Axis:   78 Text Interpretation:  Sinus tachycardia Artifact T-wave inversion in inferior and lateral leads ST depression in anterior leads Confirmed by WARD,  DO, KRISTEN ([id number]) on 10/19/2013 7:31:51 PM        CRITICAL CARE Performed by: Nyra Jabs   Total critical care time: 60 minutes  Critical care time was exclusive of separately billable procedures and treating other patients.  Critical care was necessary to treat or prevent imminent or life-threatening deterioration.  Critical care was time spent personally by me on the following activities: development of treatment plan with patient and/or surrogate as well as  nursing, discussions with consultants, evaluation of patient's response to treatment, examination of patient, obtaining history from patient or surrogate, ordering and performing treatments and interventions, ordering and review of laboratory studies, ordering and review of radiographic studies, pulse oximetry and re-evaluation of patient's condition.    [place], DO 11/01/2013 2041

## 2013-10-23 ENCOUNTER — Inpatient Hospital Stay (HOSPITAL_COMMUNITY): Payer: Medicare Other

## 2013-10-23 ENCOUNTER — Encounter (HOSPITAL_COMMUNITY): Payer: Self-pay

## 2013-10-23 DIAGNOSIS — A419 Sepsis, unspecified organism: Secondary | ICD-10-CM

## 2013-10-23 DIAGNOSIS — C3490 Malignant neoplasm of unspecified part of unspecified bronchus or lung: Secondary | ICD-10-CM

## 2013-10-23 DIAGNOSIS — J189 Pneumonia, unspecified organism: Secondary | ICD-10-CM

## 2013-10-23 LAB — GLUCOSE, CAPILLARY
GLUCOSE-CAPILLARY: 152 mg/dL — AB (ref 70–99)
Glucose-Capillary: 172 mg/dL — ABNORMAL HIGH (ref 70–99)
Glucose-Capillary: 184 mg/dL — ABNORMAL HIGH (ref 70–99)
Glucose-Capillary: 185 mg/dL — ABNORMAL HIGH (ref 70–99)
Glucose-Capillary: 143 mg/dL — ABNORMAL HIGH (ref 70–99)

## 2013-10-23 LAB — BASIC METABOLIC PANEL
Anion gap: 16 — ABNORMAL HIGH (ref 5–15)
BUN: 44 mg/dL — ABNORMAL HIGH (ref 6–23)
CO2: 22 meq/L (ref 19–32)
CREATININE: 2.4 mg/dL — AB (ref 0.50–1.35)
Calcium: 6.4 mg/dL — CL (ref 8.4–10.5)
Chloride: 104 mEq/L (ref 96–112)
GFR calc Af Amer: 28 mL/min — ABNORMAL LOW (ref >90)
GFR calc non Af Amer: 24 mL/min — ABNORMAL LOW (ref >90)
GLUCOSE: 199 mg/dL — AB (ref 70–99)
Potassium: 3.7 mEq/L (ref 3.7–5.3)
Sodium: 142 mEq/L (ref 137–147)

## 2013-10-23 LAB — URINE CULTURE
CULTURE: NO GROWTH
Colony Count: NO GROWTH

## 2013-10-23 LAB — CBC
HCT: 25.2 % — ABNORMAL LOW (ref 39.0–52.0)
HEMOGLOBIN: 8 g/dL — AB (ref 13.0–17.0)
MCH: 27.6 pg (ref 26.0–34.0)
MCHC: 31.7 g/dL (ref 30.0–36.0)
MCV: 86.9 fL (ref 78.0–100.0)
Platelets: 117 10*3/uL — ABNORMAL LOW (ref 150–400)
RBC: 2.9 MIL/uL — AB (ref 4.22–5.81)
RDW: 16.5 % — ABNORMAL HIGH (ref 11.5–15.5)
WBC: 4.2 10*3/uL (ref 4.0–10.5)

## 2013-10-23 LAB — MRSA PCR SCREENING: MRSA BY PCR: POSITIVE — AB

## 2013-10-23 LAB — CORTISOL: Cortisol, Plasma: >150 ug/dL

## 2013-10-23 LAB — LACTIC ACID, PLASMA: Lactic Acid, Venous: 2.9 mmol/L — ABNORMAL HIGH (ref 0.5–2.2)

## 2013-10-23 LAB — PROCALCITONIN: PROCALCITONIN: 39.46 ng/mL

## 2013-10-23 MED ORDER — ALBUTEROL SULFATE (2.5 MG/3ML) 0.083% IN NEBU: 2.5000 mg | INHALATION_SOLUTION | RESPIRATORY_TRACT | Status: DC | PRN

## 2013-10-23 MED ORDER — MORPHINE SULFATE 10 MG/ML IJ SOLN
1.0000 mg/h | INTRAVENOUS | Status: DC
  Administered 2013-10-23: 2 mg/h via INTRAVENOUS
  Filled 2013-10-23: qty 10

## 2013-10-23 MED ORDER — CHLORHEXIDINE GLUCONATE CLOTH 2 % EX PADS
6.0000 | MEDICATED_PAD | Freq: Every day | CUTANEOUS | Status: DC
  Administered 2013-10-23: 6 via TOPICAL

## 2013-10-23 MED ORDER — PHENYLEPHRINE HCL 10 MG/ML IJ SOLN
30.0000 ug/min | INTRAVENOUS | Status: DC
  Administered 2013-10-23: 125 ug/min via INTRAVENOUS
  Administered 2013-10-23 (×2): 200 ug/min via INTRAVENOUS
  Administered 2013-10-23: 125 ug/min via INTRAVENOUS
  Filled 2013-10-23 (×4): qty 4

## 2013-10-23 MED ORDER — CETYLPYRIDINIUM CHLORIDE 0.05 % MT LIQD
7.0000 mL | Freq: Two times a day (BID) | OROMUCOSAL | Status: DC
  Administered 2013-10-23: 7 mL via OROMUCOSAL

## 2013-10-23 MED ORDER — MORPHINE SULFATE 2 MG/ML IJ SOLN
2.0000 mg | INTRAMUSCULAR | Status: DC | PRN
  Administered 2013-10-23: 2 mg via INTRAVENOUS
  Filled 2013-10-23: qty 1

## 2013-10-23 MED ORDER — MORPHINE BOLUS VIA INFUSION
2.0000 mg | INTRAVENOUS | Status: DC | PRN
  Filled 2013-10-23: qty 4

## 2013-10-23 MED ORDER — MUPIROCIN 2 % EX OINT
1.0000 "application " | TOPICAL_OINTMENT | Freq: Two times a day (BID) | CUTANEOUS | Status: DC
  Administered 2013-10-23: 1 via NASAL
  Filled 2013-10-23: qty 22

## 2013-10-23 MED ORDER — MUPIROCIN 2 % EX OINT: 1.0000 "application " | TOPICAL_OINTMENT | Freq: Two times a day (BID) | CUTANEOUS | Status: DC

## 2013-10-23 MED ORDER — MORPHINE BOLUS VIA INFUSION
5.0000 mg | INTRAVENOUS | Status: DC | PRN
  Filled 2013-10-23: qty 20

## 2013-10-23 MED ORDER — PHENYLEPHRINE HCL 10 MG/ML IJ SOLN
30.0000 ug/min | INTRAVENOUS | Status: DC
  Administered 2013-10-23: 30 ug/min via INTRAVENOUS
  Administered 2013-10-23: 150 ug/min via INTRAVENOUS
  Filled 2013-10-23 (×2): qty 1

## 2013-10-24 LAB — PREPARE PLATELET PHERESIS: UNIT DIVISION: 0

## 2013-10-24 NOTE — Progress Notes (Signed)
No heart sounds auscultated by Kerry Fort, RN and Benjamin Stain, RN at 22:27, family at bedside.    97 cc of morphine wasted by Kerry Fort, RN and Benjamin Stain, RN.

## 2013-10-26 LAB — TYPE AND SCREEN
ABO/RH(D): O POS
Antibody Screen: NEGATIVE
UNIT DIVISION: 0
UNIT DIVISION: 0

## 2013-10-28 LAB — CULTURE, BLOOD (ROUTINE X 2)
CULTURE: NO GROWTH
Culture: NO GROWTH

## 2013-10-29 ENCOUNTER — Other Ambulatory Visit: Payer: Medicare Other

## 2013-11-05 ENCOUNTER — Ambulatory Visit: Payer: Medicare Other | Admitting: Physician Assistant

## 2013-11-05 ENCOUNTER — Other Ambulatory Visit: Payer: Medicare Other

## 2013-11-05 ENCOUNTER — Ambulatory Visit: Payer: Medicare Other

## 2013-11-06 ENCOUNTER — Ambulatory Visit: Payer: Medicare Other

## 2013-11-15 ENCOUNTER — Other Ambulatory Visit: Payer: Self-pay | Admitting: *Deleted

## 2013-11-19 NOTE — Progress Notes (Signed)
[facility] Progress Note Patient Name: Kenneth Chan DOB: 22-Jun-1930 MRN: [id number]  Date of Service  11/15/2013   HPI/Events of Note   EOL discussion  eICU Interventions  Use morphine  Take off bipap once comfrtable Full comfort   Intervention Category Major Interventions: End of life / care limitation discussion  ALVA,RAKESH V. 2013-11-15, 8:33 PM

## 2013-11-19 NOTE — Progress Notes (Signed)
Patient has decreased mental status and left is pupil non-reactive to light, MD notified, family called and updated, will continue to monitor.

## 2013-11-19 NOTE — Progress Notes (Signed)
CSW received referral that pt admitted from [facility].  CSW reviewed chart and spoke with RN. Per review and discussion, pt is not from [facility] [place] and lives with pt son. RN has confirmed this with pt son.   Inappropriate CSW referral.  Per chart, pt goals of care being addressed with pt family.   Please re-consult social work if social work needed to assist with disposition planning.   Alison Murray, MSW, [place] [facility] [phone]

## 2013-11-19 NOTE — Progress Notes (Signed)
Late Entry: Only one of the two units of blood to be administered per Dr. Elsworth Soho and [name], NP.

## 2013-11-19 NOTE — Procedures (Signed)
Central Venous Catheter Insertion Procedure Note Kenneth Chan [id number] [birth year]  Procedure: Insertion of Central Venous Catheter Indications: Assessment of intravascular volume, Drug and/or fluid administration and Frequent blood sampling  Procedure Details Consent: Risks of procedure as well as the alternatives and risks of each were explained to the (patient/caregiver).  Consent for procedure obtained. Time Out: Verified patient identification, verified procedure, site/side was marked, verified correct patient position, special equipment/implants available, medications/allergies/relevent history reviewed, required imaging and test results available.  Performed  Maximum sterile technique was used including antiseptics, cap, gloves, gown, hand hygiene, mask and sheet. Skin prep: Chlorhexidine; local anesthetic administered A antimicrobial bonded/coated triple lumen catheter was placed in the right internal jugular vein using the Seldinger technique. Ultrasound guidance used.Yes.   Catheter placed to 18 cm. Blood aspirated via all 3 ports and then flushed x 3. Line sutured x 3 and dressing applied.  Evaluation Blood flow good Complications: No apparent complications Patient did tolerate procedure well. Chest X-ray ordered to verify placement.  CXR: pending.  Kenneth Chan, ACNP [place] [facility]/Critical [facility] Pager [phone] or [phone]     Baltazar Apo, MD, PhD 2013/11/14, 9:43 AM  Pulmonary and Critical Care [phone] or if no answer [phone]

## 2013-11-19 NOTE — Progress Notes (Signed)
CARE MANAGEMENT NOTE 2013/11/13  Patient:  Kenneth Chan, Kenneth Chan   Account Number:  [id number]  Date Initiated:  11-13-2013  Documentation initiated by:  DAVIS,RHONDA  Subjective/Objective Assessment:   pt with resp distress and abnormal wbc     Action/Plan:   from [facility]   Anticipated DC Date:  10/26/2013   Anticipated DC Plan:  ASSISTED LIVING / REST HOME  In-house referral  Clinical Social Worker      DC Planning Services  NA      [facility] Choice  NA   Choice offered to / List presented to:  NA   DME arranged  NA      DME agency  NA     [place] arranged  NA      [place] agency  NA   Status of service:  In process, will continue to follow Medicare Important Message given?  NA - LOS <3 / Initial given by admissions (If response is "NO", the following Medicare IM given date fields will be blank) Date Medicare IM given:   Medicare IM given by:   Date Additional Medicare IM given:   Additional Medicare IM given by:    Discharge Disposition:    Per UR Regulation:  Reviewed for med. necessity/level of care/duration of stay  If discussed at [place] of Stay Meetings, dates discussed:    Comments:  10/21/2013/Rhonda Rosana Hoes, RN, BSN, CCM: CHART REVIEWED AND UPDATED.  Next chart review due on [id number]. NO DISCHARGE NEEDS PRESENT AT THIS TIME WILL CONTINUE TO FOLLOW. CASE MANAGEMENT [phone]

## 2013-11-19 NOTE — Progress Notes (Signed)
PULMONARY / CRITICAL CARE MEDICINE   Name: Kenneth Chan MRN: [id number] DOB: March 23, 1930    ADMISSION DATE:  11/10/2013 CONSULTATION DATE:  10/19/2013  REFERRING MD :  EDP  CHIEF COMPLAINT:  Weakness  INITIAL PRESENTATION: 80 yo male stage IV non-small cell T1N1M0 lung ca on chemo with  presented to [facility] ED 8/4 c/o weakness x 1 week. Worse 8/3. Was unable to stand.  In ED noted to be pancytopenic, febrile, and hypotensive. Followed by MW as outpt. PCCM asked to see.   STUDIES:   SIGNIFICANT EVENTS: 8/4 Admitted to [facility] ICU  HISTORY OF PRESENT ILLNESS:  80 year old male with PMH as below, which includes stage IV non-small cell lung cancer currently on carboplatin/paclitaxel for palliative therapy s/p 2 treatments. Most recent 7/28, received  Neupogen. He also has ulcerative mucositis and pancytopenia as a consequence of the chemo. Also, COPD (on O2 at night), DM, HTN, hypothyroid. He is an office pt of MW, and Dr Julien Nordmann for oncology. He presented to [facility] ED 8/4 complaining of weakness x 1 week. He was found to be hypoxic, hypotensive, and pancytopenic, and was transfused with blood and plt in ED. PCCM asked to see.     SUBJECTIVE:  Awake on bipap VITAL SIGNS: Temp:  [96.8 F (36 C)-103.1 F (39.5 C)] 96.8 F (36 C) (08/05 0700) Pulse Rate:  [89-136] 94 (08/05 0756) Resp:  [14-36] 22 (08/05 0756) BP: (80-141)/(32-80) 99/69 mmHg (08/05 0756) SpO2:  [83 %-100 %] 95 % (08/05 0806) FiO2 (%):  [94 %-100 %] 100 % (08/05 0806) Weight:  [149 lb 11.1 oz (67.9 kg)-155 lb (70.308 kg)] 149 lb 11.1 oz (67.9 kg) (08/05 0400) HEMODYNAMICS: CVP:  [3 mmHg-6 mmHg] 6 mmHg VENTILATOR SETTINGS: Vent Mode:  [-]  FiO2 (%):  [94 %-100 %] 100 % INTAKE / OUTPUT:  Intake/Output Summary (Last 24 hours) at 10/26/13 0901 Last data filed at 10-26-2013 0700  Gross per 24 hour  Intake 2094.38 ml  Output    145 ml  Net 1949.38 ml    PHYSICAL EXAMINATION: General:  Elderly, cachectic male in no acute  respiratory distress on bipap Neuro:  Alert, oriented. Follows comaands HEENT:  [place]/AT, mild JVD, NIMVS mask in place Cardiovascular:  RRR, no MRG Lungs:  Poor air movement R.  Abdomen:  Soft, non-tender, non-distended Musculoskeletal:  No acute deformity Skin:Erythemia/petechia to BLE. Stage 2 wounds to Sacrum, BL heels.   LABS:  CBC  Recent Labs Lab 10/29/2013 1917 26-Oct-2013 0530  WBC 0.6* 4.2  HGB 7.4* 8.0*  HCT 23.3* 25.2*  PLT 60* 117*   Coag's  Recent Labs Lab 10/29/2013 1917  APTT 36  INR 1.42   BMET  Recent Labs Lab 11/06/2013 1917 2013/10/26 0530  NA 142 142  K 3.6* 3.7  CL 100 104  CO2 25 22  BUN 43* 44*  CREATININE 1.92* 2.40*  GLUCOSE 128* 199*   Electrolytes  Recent Labs Lab 10/26/2013 1917 10/26/13 0530  CALCIUM 7.5* 6.4*   Sepsis Markers  Recent Labs Lab 11/17/2013 1930 10/26/13 0530  LATICACIDVEN 2.15 2.9*  PROCALCITON  --  39.46   ABG No results found for this basename: PHART, PCO2ART, PO2ART,  in the last 168 hours Liver Enzymes  Recent Labs Lab 11/13/2013 1917  AST 25  ALT 18  ALKPHOS 82  BILITOT 0.5  ALBUMIN 2.0*   Cardiac Enzymes  Recent Labs Lab 11/15/2013 1927 11/16/2013 1949  TROPONINI  --  <0.30  PROBNP 7739.0*  --  Glucose  Recent Labs Lab 11/07/2013 2329 11-06-13 0506  GLUCAP 122* 172*    Imaging Dg Chest Port 1 View  (if Code Sepsis Called)  10/27/2013   CLINICAL DATA:  Fever.  Failure to thrive.  Fall  EXAM: PORTABLE CHEST - 1 VIEW  COMPARISON:  CT chest 06/19/2013, chest x-ray 06/19/2013.  FINDINGS: Large mass right upper lobe consistent with carcinoma as noted on prior CT and PET. Progressive volume loss around the tumor which may be due to collapse and/or pneumonia. Right apical blebs.  Patchy infiltrate right lower lobe has the appearance of pneumonia. This was not present previously. Small right pleural effusion.  Vague density left upper lobe. No lesion is seen in this area on the prior CT and this may  represent some scar or overlying tissues. No infiltrate or effusion on the left. Negative for heart failure.  IMPRESSION: Right upper lobe mass lesion compatible with carcinoma as noted on prior studies.  Right lower lobe infiltrate suggesting pneumonia with a small right effusion   Electronically Signed   By: Franchot Gallo M.D.   On: 11/04/2013 19:34     ASSESSMENT / PLAN:  PULMONARY A: Acute on chronic hypoxic respiratory failure - 2nd to PNA NSCLC Stage IV on palliative chemo - last tx 7/28 COPD without acute exacerbation.  He does not want further chemo.  P:   Supplemental O2 as needed to maintain O2 sat greater than 92% Scheduled BD's F/u CXR in AM Patient is DNR Discussion with family concerning dc nimvs and goal of care comfort.  CARDIOVASCULAR A:  Severe Sepsis H/o HTN, MAT  P:  MAP goal >= 65 Gentle IVF hydration to maintain MAP goal Hold home antihypertensives (valsartan-hctz, verapamil) CVL/pressors ongoing, but dc pressors if full comfort mode  RENAL A:   AKI P:   Follow BMP  GASTROINTESTINAL A:   Melena GI ppx P:   NPO PPI  HEMATOLOGIC A:   Pancytopenia 2nd to chemo, s/p transfusions P:  Follow CBC Hold ASA Received neupogen 7/29  INFECTIOUS A:   Severe sepsis - PNA + UTI +/- BLE cellulitis.  P:   BCx2 8/4>>> UC 8/4 >>> Abx: Vanc, start date 8/4 Abx: Zosyn, start date 8/4 Follow PCT Follow WBC and fever curve DC abx if full comfort mode ENDOCRINE A:   DM Hypothyroid P:   CBG checks q4hours SSI if Glucose consistently > 180.  Hold home synthroid until able to take PO. Hold metformin  NEUROLOGIC A:  Decreased LOC P:   Monitor  Global:  8/5 0830, 69min discussion with family concerning goals of care. They wish to honor Kenneth Chan request for no further chemotherapy and pursue full DNR and comfort mode. Family will have further discussion with MD.  45 minutes CC time  [facility] Minor ACNP Maryanna Shape PCCM Pager [phone] till 3  pm If no answer page [phone] 2013-11-06, 9:08 AM   Baltazar Apo, MD, PhD 06-Nov-2013, 9:46 AM [place] [facility]monary and Critical Care [phone] or if no answer [phone]

## 2013-11-19 NOTE — Progress Notes (Signed)
Central line placement verified by Dr. Nelda Marseille.

## 2013-11-19 NOTE — Progress Notes (Signed)
Late entry: 500cc bolus given per [name], NP. Verbal Order. Bolus given aroun 2330 on 8/4//15.

## 2013-11-19 NOTE — Progress Notes (Signed)
Pt back on BIPAP for now due to WOB, RN at bedside.

## 2013-11-19 NOTE — Progress Notes (Signed)
Pt found on NRB.  Bipap removed by RN per MD.  OH60, rr25,spo2 73%.  Per RN/MD, pt now full comfort care.

## 2013-11-19 NOTE — Progress Notes (Signed)
Spoke with family over the phone, after discussion, they are all in agreement that patient would not want aggressive resuscitative efforts.  Spoke with Carmie Kanner, patient's daughter, confirmed that.  Will make full DNR at this point.  Rush Farmer, M.D. [facility] Pulmonary/Critical Care Medicine. Pager: [phone]. After hours pager: [phone].

## 2013-11-19 NOTE — Progress Notes (Signed)
Rt took pt off BIPAP per MD order placed on 100% NRB.

## 2013-11-19 DEATH — deceased

## 2013-12-10 NOTE — Discharge Summary (Signed)
PULMONARY / CRITICAL CARE MEDICINE DEATH SUMMARY   Name: Kenneth Chan MRN: [id number] DOB: [birth year]    ADMISSION DATE:  October 28, 2013 CONSULTATION DATE:  10/28/2013 DATE OF DEATH: 10-29-13  Final cause of death:  Healthcare associated pneumonia  Secondary causes of death:  Acute hypoxemic respiratory failure Stage IV non-small cell lung cancer COPD Pancytopenia Severe sepsis, septic shock Mucositis daibetes mellitus Hx HTN hypothyroidism Anemia  GI Bleeding / melena Metabolic encephalopathy   HISTORY OF PRESENT ILLNESS:  80 year old male with PMH as below, which includes stage IV non-small cell lung cancer currently on carboplatin/paclitaxel for palliative therapy s/p 2 treatments. Most recent 7/28, received  Neupogen. He also has ulcerative mucositis and pancytopenia as a consequence of the chemo. Also, COPD (on O2 at night), DM, HTN, hypothyroid. He presented to [facility] ED 2022-10-29 complaining of weakness x 1 week. He was found to be hypoxic, hypotensive, and pancytopenic, and was transfused with blood and plt in ED. CXR revealed a probable opportunistic PNA. He wa admitted to ICU, supported with pressors, broad spectrum abx, NIPPV and oxygen. He evolved progressive respiratory distress, even with BiPAP. He had stated in the past that he would not want intubation, and his code status was confirmed as DNR.  After discussion with family regarding his course adn prognosis, decision was made to initiate palliative morphine and to transition off of BiPAP. He died 11-29-13.     Baltazar Apo, MD, PhD 12/10/2013, 11:46 PM [place] [facility] [phone] or if no answer [phone]

## 2013-12-17 ENCOUNTER — Ambulatory Visit: Payer: Medicare Other | Admitting: Internal Medicine

## 2014-06-15 IMAGING — PT NM PET TUM IMG INITIAL (PI) SKULL BASE T - THIGH
1 of 8 series · 1 of 25 positions shown · non-contrast
Comparison: None.

CLINICAL DATA: Initial treatment strategy for right lung mass.

EXAM:
NUCLEAR MEDICINE PET SKULL BASE TO THIGH
TECHNIQUE: 8.4 mCi F-18 FDG was injected intravenously. Full-ring PET imaging
was performed from the skull base to thigh after the radiotracer. CT
data was obtained and used for attenuation correction and anatomic
localization.
FASTING BLOOD GLUCOSE:  Value: 100 mg/dl

[Series 4: ct sk_thigh 5.0 b31f · axial · 5.0mm · 0.92mm/px · 1 of 219 slices shown]
[im 219/219  brain]
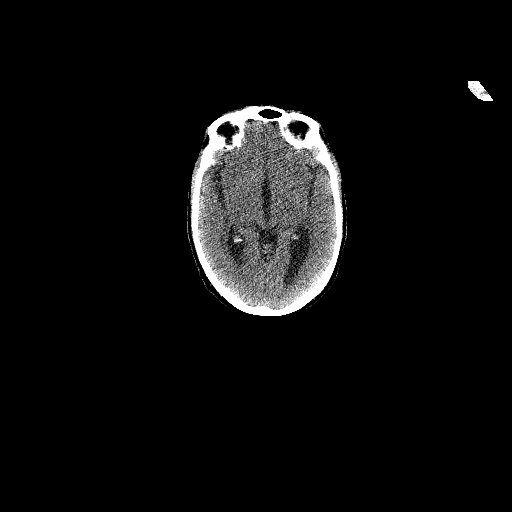

[1 of 25 positions shown; findings below may reference images not displayed]

FINDINGS: NECK

No hypermetabolic lymph nodes in the neck.

CHEST

Hypermetabolic right upper lobe mass measures 6.9 x 8.0 cm with
intense peripheral metabolic activity (SUV max 16.2). This mass
surrounds the right upper lobe bronchus.

There are no hypermetabolic mediastinal lymph nodes. There is likely
hypermetabolic right hilar lymph node which is contiguous with the
mass.

There is extensive centrilobular emphysema with bullous change in
the right upper lobe.

ABDOMEN/PELVIS

There is mild metabolic activity associated with bilateral adrenal
nodularity. Adrenal glands have Hounsfield units less than 10
consistent with benign adenomas. The adrenal metabolic activity is
similar to liver activity.

There is a large low-density lesion in the right hepatic lobe which
does not have metabolic activity. No hypermetabolic abdominal pelvic
lymph nodes. The gallbladder is contracted around several
gallstones. There is intense metabolic activity associated with the
sigmoid colon in a region of multiple diverticula.

SKELETON

No focal hypermetabolic activity to suggest skeletal metastasis.
IMPRESSION: 1. Hypermetabolic right upper lobe mass.
2. Hypermetabolic right hilar lymph node.
3. No evidence of mediastinal hypermetabolic nodes or distant
metastasis.
4. Bilateral adrenal adenomas.
5. Staging by FDG PET imaging T3 N1 M0

## 2014-09-29 IMAGING — CR DG CHEST 1V PORT
1 series · 1 of 1 positions shown · non-contrast
Comparison: CT chest 06/19/2013, chest x-ray 06/19/2013.

CLINICAL DATA: Fever.  Failure to thrive.  Fall

EXAM:
PORTABLE CHEST - 1 VIEW

[AP]
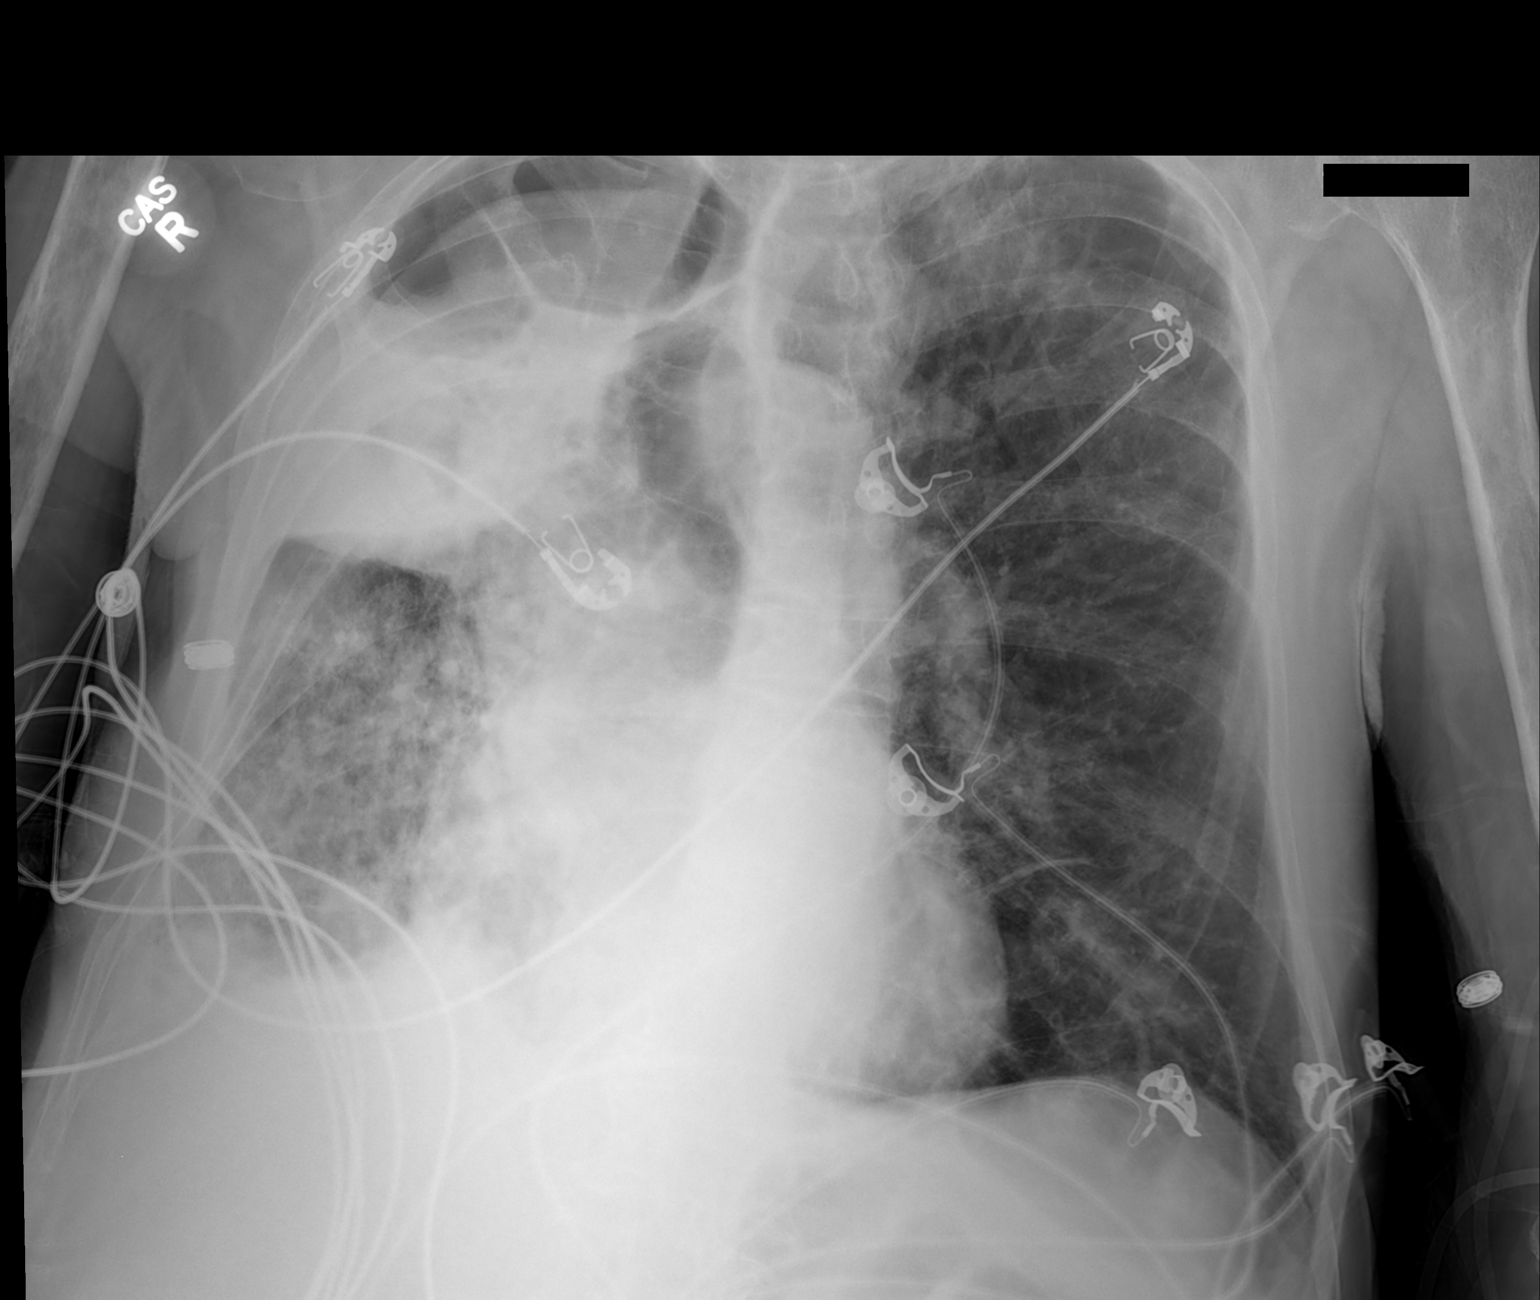

[1 of 1 positions shown; findings below may reference images not displayed]

FINDINGS: Large mass right upper lobe consistent with carcinoma as noted on
prior CT and PET. Progressive volume loss around the tumor which may
be due to collapse and/or pneumonia. Right apical blebs.

Patchy infiltrate right lower lobe has the appearance of pneumonia.
This was not present previously. Small right pleural effusion.

Vague density left upper lobe. No lesion is seen in this area on the
prior CT and this may represent some scar or overlying tissues. No
infiltrate or effusion on the left. Negative for heart failure.
IMPRESSION: Right upper lobe mass lesion compatible with carcinoma as noted on
prior studies.

Right lower lobe infiltrate suggesting pneumonia with a small right
effusion
